# Patient Record
Sex: Male | Born: 1970 | Race: White | Hispanic: No | Marital: Married | Smoking: Never smoker
Health system: Southern US, Community
[De-identification: ages and names within clinical notes are randomized; demographics above are authoritative.]

## PROBLEM LIST (undated history)

## (undated) DIAGNOSIS — E785 Hyperlipidemia, unspecified: Secondary | ICD-10-CM

## (undated) DIAGNOSIS — Z87442 Personal history of urinary calculi: Secondary | ICD-10-CM

## (undated) DIAGNOSIS — F419 Anxiety disorder, unspecified: Secondary | ICD-10-CM

## (undated) DIAGNOSIS — K5792 Diverticulitis of intestine, part unspecified, without perforation or abscess without bleeding: Secondary | ICD-10-CM

## (undated) DIAGNOSIS — R51 Headache: Secondary | ICD-10-CM

## (undated) DIAGNOSIS — M5430 Sciatica, unspecified side: Secondary | ICD-10-CM

## (undated) DIAGNOSIS — T7840XA Allergy, unspecified, initial encounter: Secondary | ICD-10-CM

## (undated) DIAGNOSIS — R519 Headache, unspecified: Secondary | ICD-10-CM

## (undated) DIAGNOSIS — A498 Other bacterial infections of unspecified site: Secondary | ICD-10-CM

## (undated) HISTORY — DX: Hyperlipidemia, unspecified: E78.5

## (undated) HISTORY — PX: TYMPANOSTOMY TUBE PLACEMENT: SHX32

## (undated) HISTORY — PX: LITHOTRIPSY: SUR834

## (undated) HISTORY — DX: Anxiety disorder, unspecified: F41.9

## (undated) HISTORY — DX: Allergy, unspecified, initial encounter: T78.40XA

## (undated) HISTORY — PX: WISDOM TOOTH EXTRACTION: SHX21

## (undated) HISTORY — PX: EYE SURGERY: SHX253

---

## 2005-06-21 ENCOUNTER — Emergency Department: Payer: Self-pay | Admitting: Internal Medicine

## 2006-11-24 ENCOUNTER — Emergency Department: Payer: Self-pay | Admitting: Emergency Medicine

## 2014-03-05 ENCOUNTER — Emergency Department: Payer: Self-pay | Admitting: Emergency Medicine

## 2014-03-05 LAB — COMPREHENSIVE METABOLIC PANEL
ALK PHOS: 95 U/L
AST: 21 U/L (ref 15–37)
Albumin: 4.1 g/dL (ref 3.4–5.0)
Anion Gap: 5 — ABNORMAL LOW (ref 7–16)
BUN: 16 mg/dL (ref 7–18)
Bilirubin,Total: 0.4 mg/dL (ref 0.2–1.0)
CO2: 29 mmol/L (ref 21–32)
Calcium, Total: 8.9 mg/dL (ref 8.5–10.1)
Chloride: 103 mmol/L (ref 98–107)
Creatinine: 1.1 mg/dL (ref 0.60–1.30)
Glucose: 122 mg/dL — ABNORMAL HIGH (ref 65–99)
Osmolality: 276 (ref 275–301)
Potassium: 3.7 mmol/L (ref 3.5–5.1)
SGPT (ALT): 23 U/L (ref 12–78)
Sodium: 137 mmol/L (ref 136–145)
Total Protein: 7.3 g/dL (ref 6.4–8.2)

## 2014-03-05 LAB — URINALYSIS, COMPLETE
BACTERIA: NONE SEEN
BILIRUBIN, UR: NEGATIVE
BLOOD: NEGATIVE
GLUCOSE, UR: NEGATIVE mg/dL (ref 0–75)
Leukocyte Esterase: NEGATIVE
Nitrite: NEGATIVE
Ph: 5 (ref 4.5–8.0)
Protein: 30
RBC,UR: 2 /HPF (ref 0–5)
Specific Gravity: 1.032 (ref 1.003–1.030)
WBC UR: 4 /HPF (ref 0–5)

## 2014-03-05 LAB — CBC
HCT: 40.8 % (ref 40.0–52.0)
HGB: 14.3 g/dL (ref 13.0–18.0)
MCH: 31.2 pg (ref 26.0–34.0)
MCHC: 35 g/dL (ref 32.0–36.0)
MCV: 89 fL (ref 80–100)
Platelet: 212 10*3/uL (ref 150–440)
RBC: 4.59 10*6/uL (ref 4.40–5.90)
RDW: 12.4 % (ref 11.5–14.5)
WBC: 9.7 10*3/uL (ref 3.8–10.6)

## 2014-03-07 ENCOUNTER — Ambulatory Visit: Payer: Self-pay | Admitting: Urology

## 2014-03-09 ENCOUNTER — Ambulatory Visit: Payer: Self-pay | Admitting: Urology

## 2014-03-10 ENCOUNTER — Ambulatory Visit: Payer: Self-pay | Admitting: Urology

## 2015-03-31 IMAGING — CT CT STONE STUDY
3 of 4 series · 5 of 16 positions shown, 6 images · non-contrast
Comparison: None.

CLINICAL DATA: Left flank pain, nausea and diarrhea.

EXAM:
CT ABDOMEN AND PELVIS WITHOUT CONTRAST
TECHNIQUE: Multidetector CT imaging of the abdomen and pelvis was performed
following the standard protocol without IV contrast.

[Series 4: lung · axial · 0.67mm/px · z∈[-1173,-1173]mm · 1 of 25 slices shown, 2 images]
[im 1/25  soft-tissue]
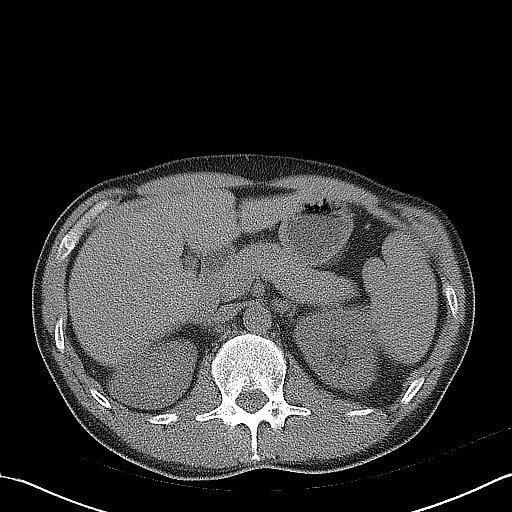
[im 1/25  bone]
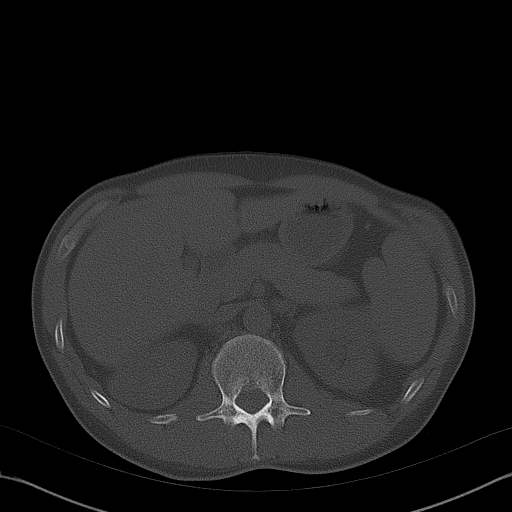

[Series 5: coronal · coronal · 0.61mm/px · 3 of 114 slices shown]
[im 29/114  soft-tissue]
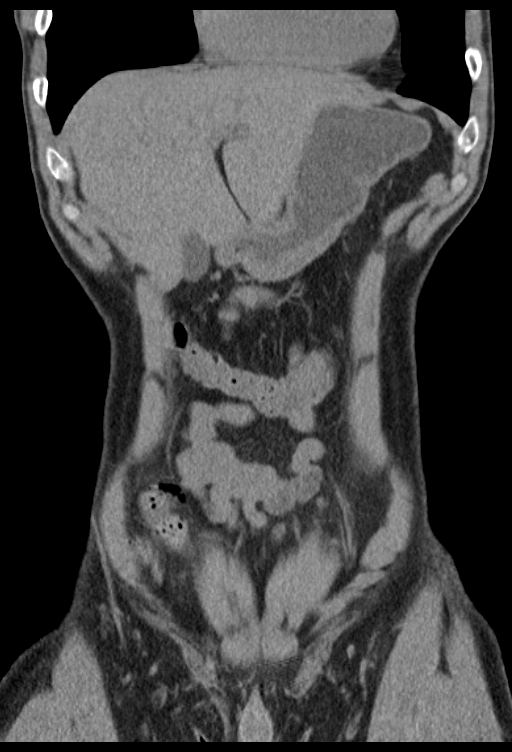
[im 57/114  soft-tissue]
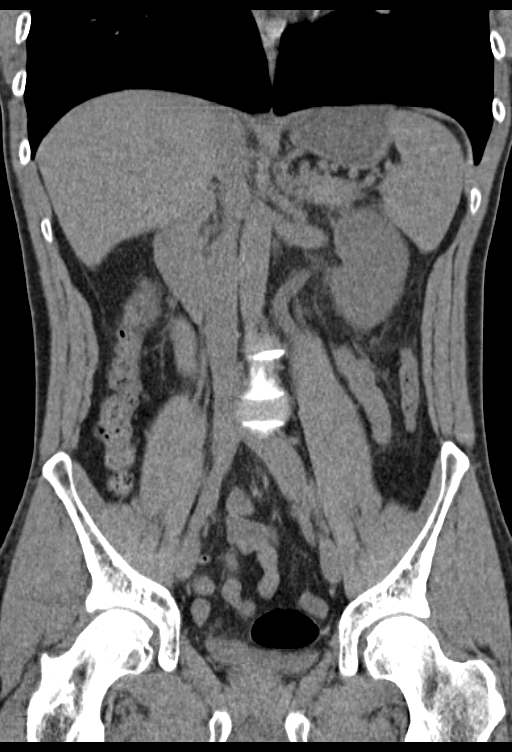
[im 85/114  soft-tissue]
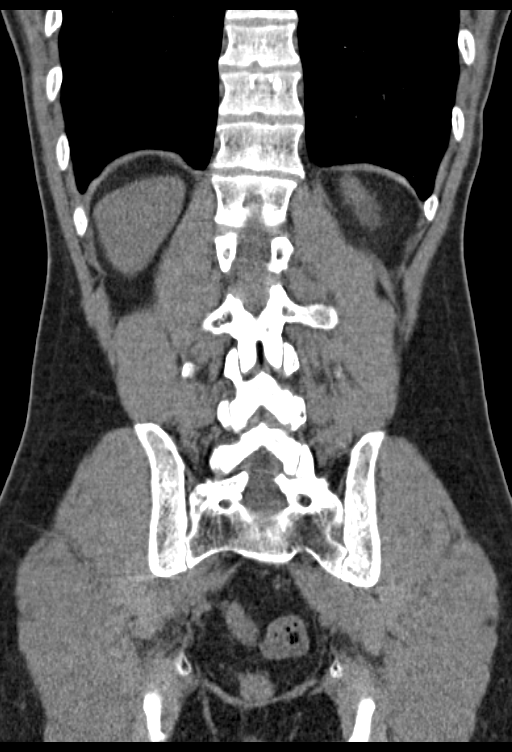

[Series 6: sagittal · sagittal · 0.47mm/px · 1 of 154 slices shown]
[im 62/154  soft-tissue]
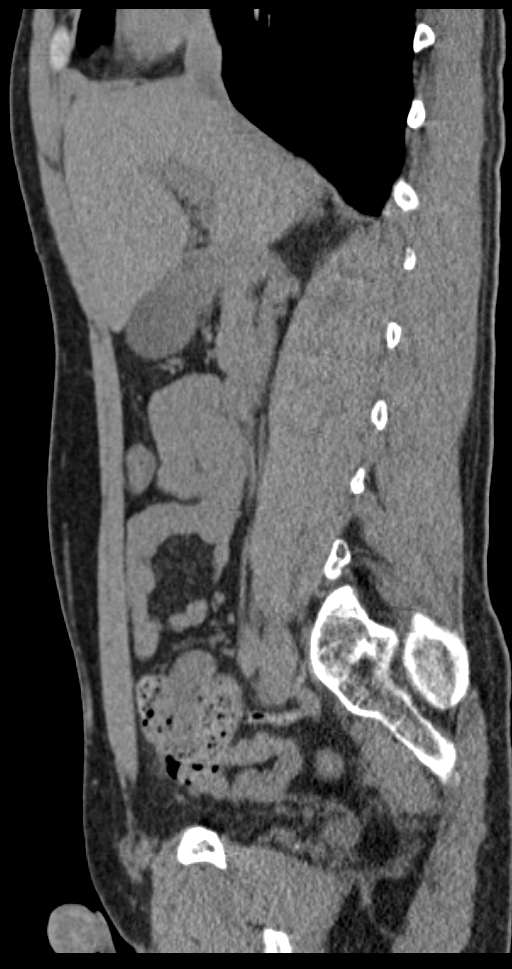

[5 of 16 positions shown; findings below may reference images not displayed]

FINDINGS: Mild to moderate dilatation of the left renal collecting system and
ureter to the level of a 5 mm distal left ureteral calculus at the
ureterovesical junction. The scout image extends slightly below the
level of the calculus without including the entire inferior pelvis.
The calculus is not visible on that image. No bladder, or right
ureteral or renal calculi are seen. The left kidney is mildly
enlarged with mild left perinephric soft tissue stranding. The right
kidney has a normal appearance.

Normal non contrasted appearance of the liver, spleen, pancreas,
gallbladder, adrenal glands and prostate gland. No gastrointestinal
abnormalities or enlarged lymph nodes. Clear lung bases. Mild lumbar
and lower thoracic spine degenerative changes.
IMPRESSION: 5 mm distal left ureteral calculus at the ureterovesical junction,
causing mild to moderate left hydronephrosis and hydroureter.

## 2015-04-02 IMAGING — CR DG ABDOMEN 1V
1 series · 2 of 2 positions shown · non-contrast
Comparison: CT STONE STUDY dated 03/05/2014

CLINICAL DATA: Recent left UVJ stone

EXAM:
ABDOMEN - 1 VIEW

[Series 1: supine kub · 0.17mm/px · 2 of 2 slices shown]
[im 1/2]
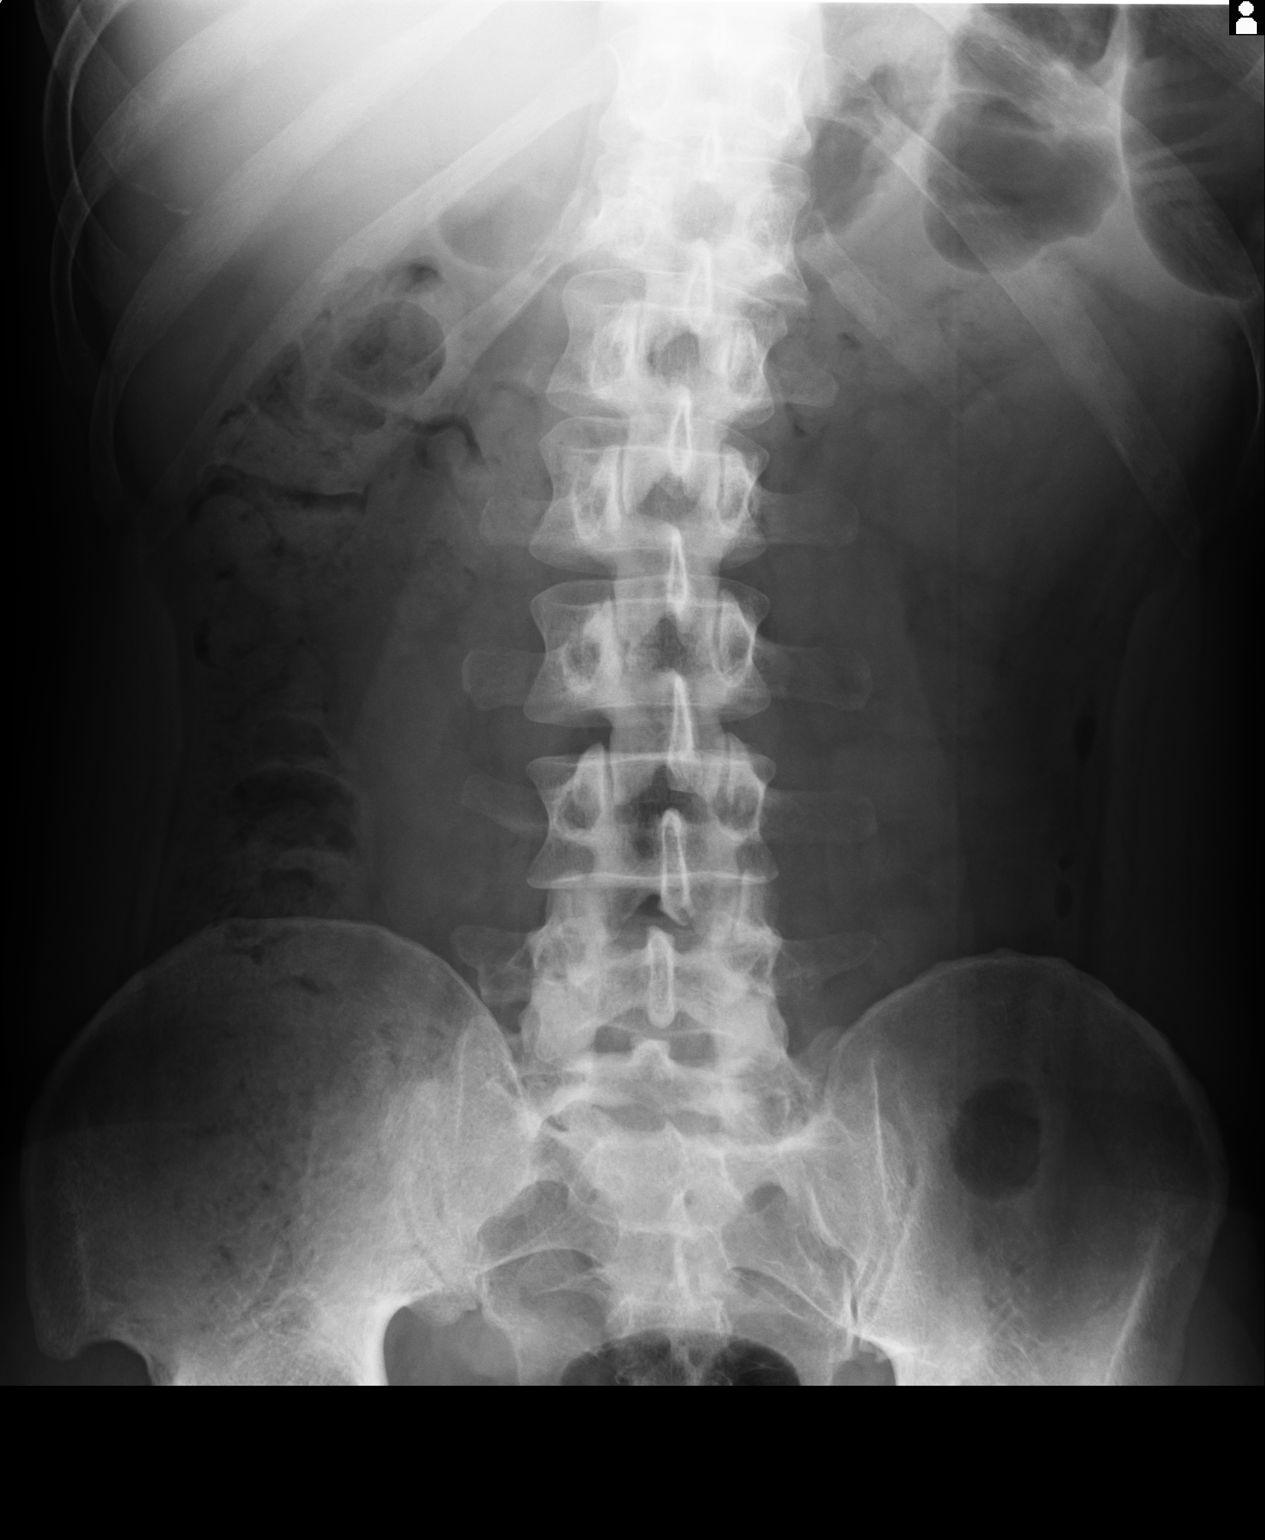
[im 2/2]
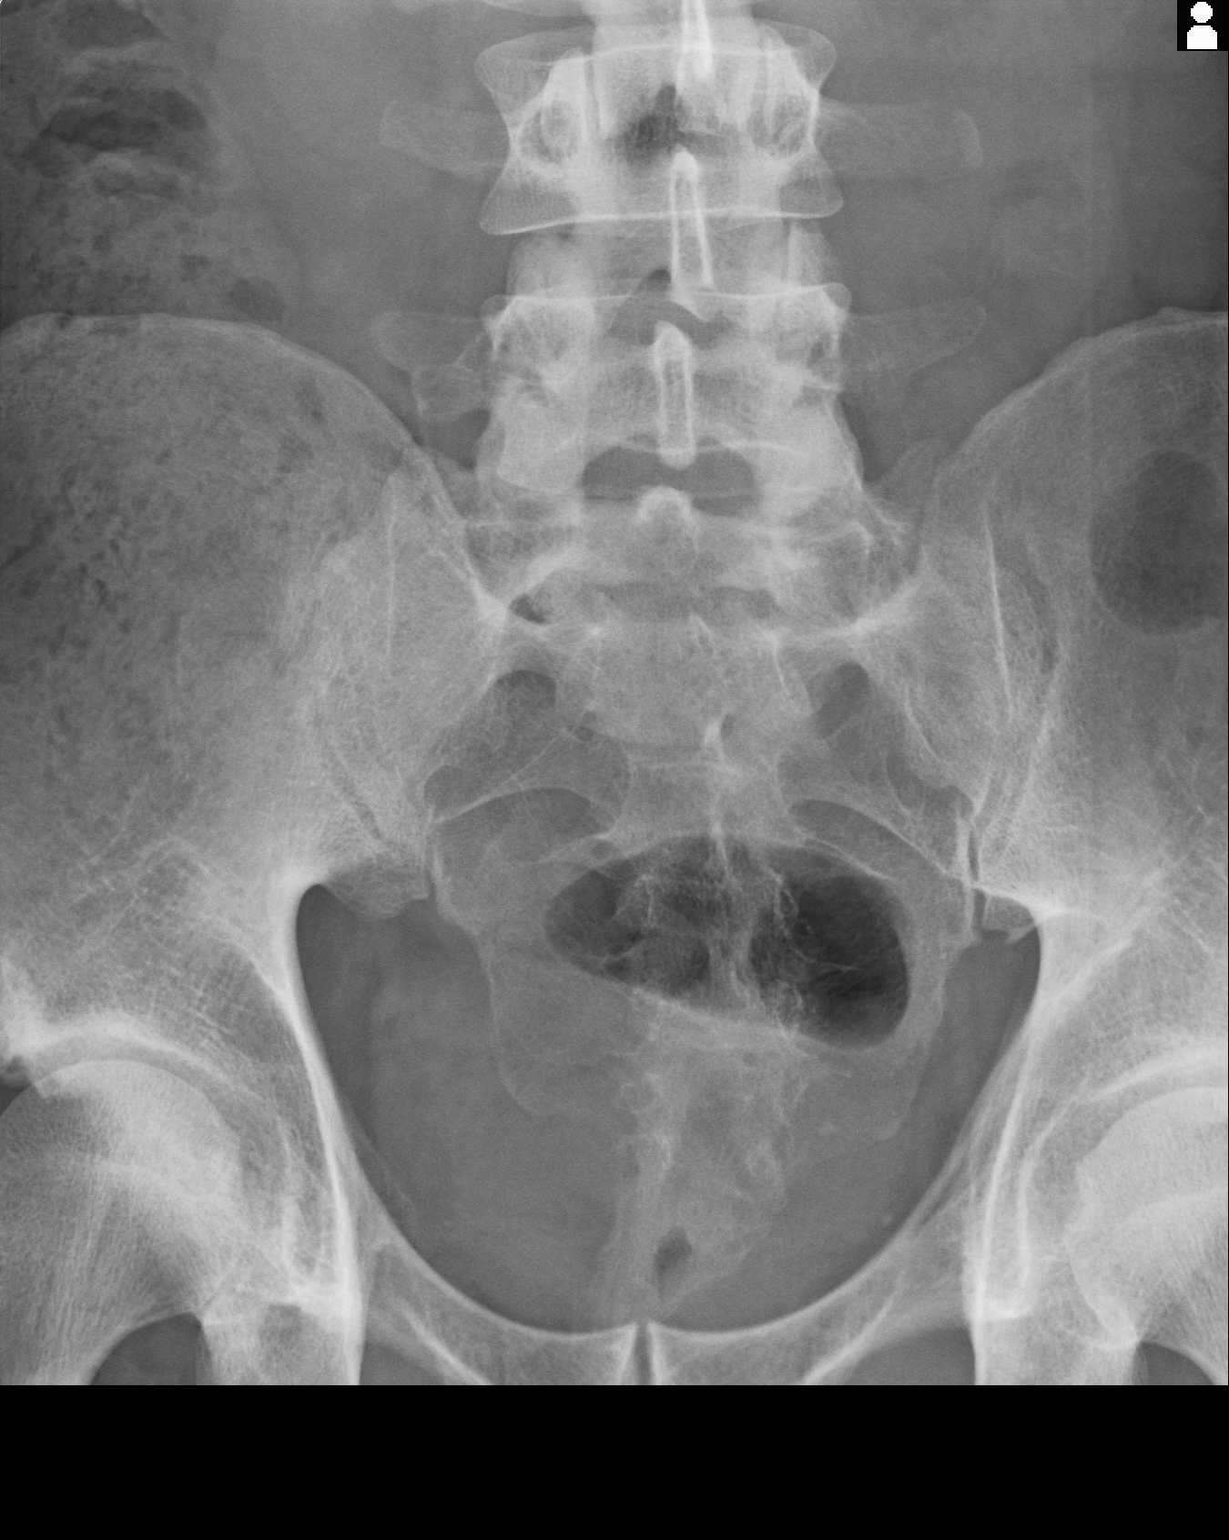

[2 of 2 positions shown; findings below may reference images not displayed]

FINDINGS: No abnormal calcifications are demonstrated over the kidneys or
proximal or mid ureters. Within the true bony pelvis there is a
coarse calcification projecting over the left aspect of the lower
sacrum which likely reflects the known approximately 5 mm diameter
UVJ stone.

The bowel gas pattern is within the limits of normal. The bony
structures exhibit no acute abnormality.
IMPRESSION: The known distal left ureteral stone is again demonstrated and does
not appear to have changed significantly in position since the
previous study.

## 2015-04-05 IMAGING — CR DG ABDOMEN 1V
1 series · 2 of 2 positions shown · non-contrast
Comparison: DG ABDOMEN 1V dated 03/07/2014 new

CLINICAL DATA: ? Left renal Calculus

EXAM:
ABDOMEN - 1 VIEW

[Series 1: t abdomen supine · 0.14mm/px · 2 of 2 slices shown]
[im 1/2]
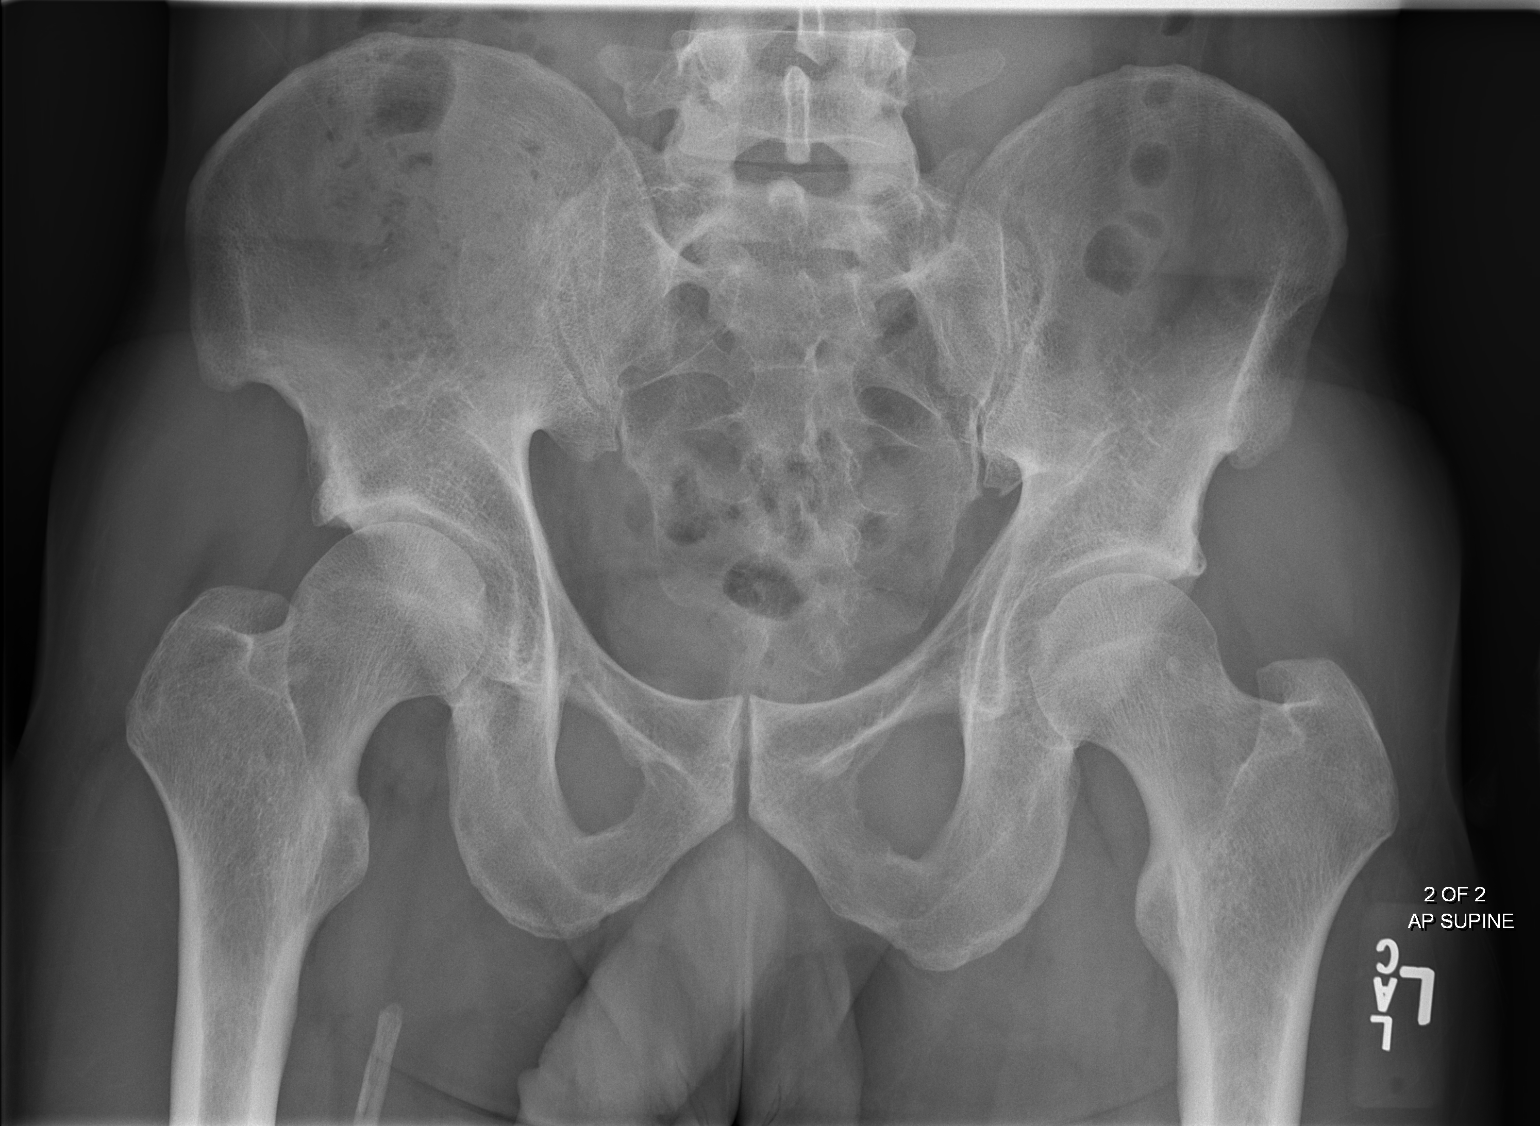
[im 2/2]
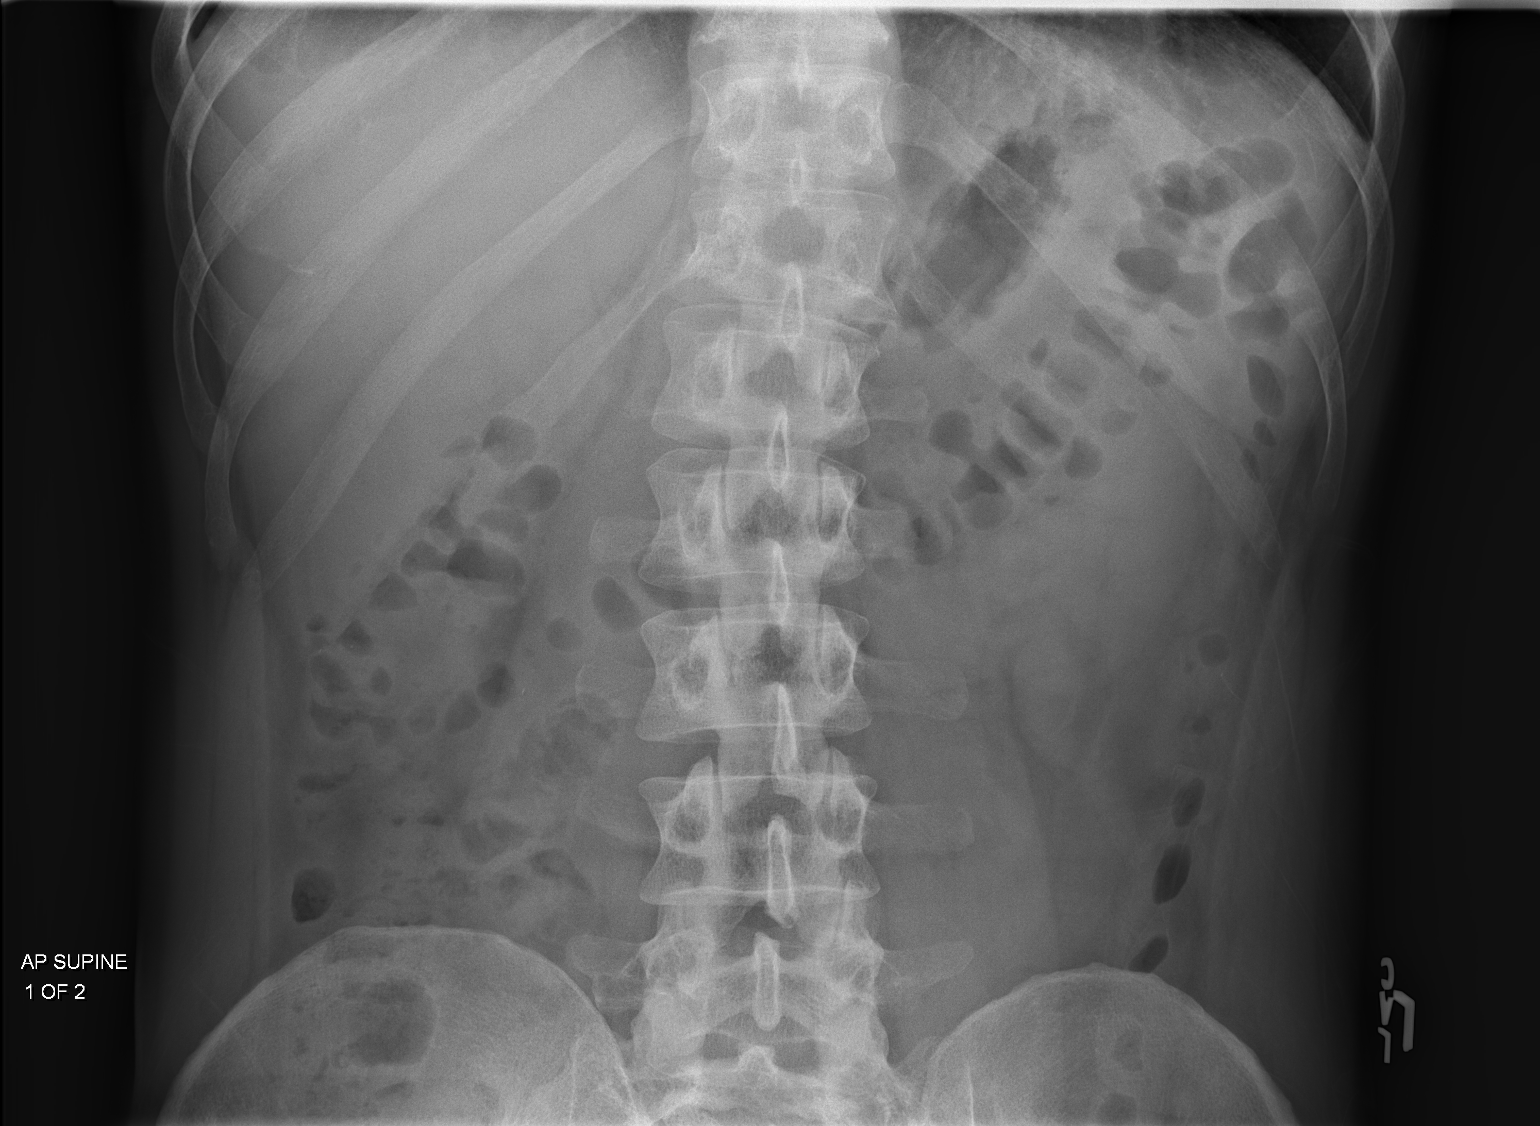

[2 of 2 positions shown; findings below may reference images not displayed]

FINDINGS: The bowel gas pattern is normal. No radio-opaque calculi or other
significant radiographic abnormality are seen.
IMPRESSION: Negative.

## 2015-07-31 ENCOUNTER — Encounter: Payer: Self-pay | Admitting: *Deleted

## 2015-08-03 NOTE — Discharge Instructions (Signed)
[facility] °[facility] °ENDOSCOPIC SINUS SURGERY ° [facility] ° °What is Functional Endoscopic Sinus Surgery? ° The Surgery involves making the natural openings of the sinuses larger by removing the bony partitions that separate the sinuses from the nasal cavity.  The natural sinus lining is preserved as much as possible to allow the sinuses to resume normal function after the surgery.  In some patients nasal polyps (excessively swollen lining of the sinuses) may be removed to relieve obstruction of the sinus openings.  The surgery is performed through the nose using lighted scopes, which eliminates the need for incisions on the face.  A septoplasty is a different procedure which is sometimes performed with sinus surgery.  It involves straightening the boy partition that separates the two sides of your nose.  A crooked or deviated septum may need repair if is obstructing the sinuses or nasal airflow.  Turbinate reduction is also often performed during sinus surgery.  The turbinates are bony proturberances from the side walls of the nose which swell and can obstruct the nose in patients with sinus and allergy problems.  Their size can be surgically reduced to help relieve nasal obstruction. ° °What Can Sinus Surgery Do For Me? ° Sinus surgery can reduce the frequency of sinus infections requiring antibiotic treatment.  This can provide improvement in nasal congestion, post-nasal drainage, facial pressure and nasal obstruction.  Surgery will NOT prevent you from ever having an infection again, so it usually only for patients who get infections 4 or more times yearly requiring antibiotics, or for infections that do not clear with antibiotics.  It will not cure nasal allergies, so patients with allergies may still require medication to treat their allergies after surgery. Surgery may improve headaches related to sinusitis, however, some people will continue to  require medication to control sinus headaches related to allergies.  Surgery will do nothing for other forms of headache (migraine, tension or cluster). °What Are the Risks of Endoscopic Sinus Surgery? ° Current techniques allow surgery to be performed safely with little risk, however, there are rare complications that patients should be aware of.  Because the sinuses are located around the eyes, there is risk of eye injury, including blindness, though again, this would be quite rare. This is usually a result of bleeding behind the eye during surgery, which puts the vision oat risk, though there are treatments to protect the vision and prevent permanent disrupted by surgery causing a leak of the spinal fluid that surrounds the brain.  More serious complications would include bleeding inside the brain cavity or damage to the brain.  Again, all of these complications are uncommon, and spinal fluid leaks can be safely managed surgically if they occur.  The most common complication of sinus surgery is bleeding from the nose, which may require packing or cauterization of the nose.  Continued sinus have polyps may experience recurrence of the polyps requiring revision surgery.  Alterations of sense of smell or injury to the tear ducts are also rare complications.  °What is the Surgery Like, and what is the Recovery? ° The Surgery usually takes a couple of hours to perform, and is usually performed under a general anesthetic (completely asleep).  Patients are usually discharged home after a couple of hours.  Sometimes during surgery it is necessary to pack the nose to control bleeding, and the packing is left in place for 24 - 48 hours, and removed by your surgeon.  If   a septoplasty was performed during the procedure, there is often a splint placed which must be removed after 5-7 days.   °Discomfort: Pain is usually mild to moderate, and can be controlled by prescription pain medication or acetaminophen (Tylenol).   Aspirin, Ibuprofen (Advil, Motrin), or Naprosyn (Aleve) should be avoided, as they can cause increased bleeding.  Most patients feel sinus pressure like they have a bad head cold for several days.  Sleeping with your head elevated can help reduce swelling and facial pressure, as can ice packs over the face.  A humidifier may be helpful to keep the mucous and blood from drying in the nose.  °Diet: There are no specific diet restrictions, however, you should generally start with clear liquids and a light diet of bland foods because the anesthetic can cause some nausea.  Advance your diet depending on how your stomach feels.  Taking your pain medication with food will often help reduce stomach upset which pain medications can cause. ° °Nasal Saline Irrigation: It is important to remove blood clots and dried mucous from the nose as it is healing.  This is done by having you irrigate the nose at least 3 - 4 times daily with a salt water solution.  The salt water solution is made as follows:  1) 2 - 3 heaping teaspoons of canning, pickling or sea salt °  2) 1 teaspoon baking soda, such as [employer] °  3) 1 quart of warm distilled water °The nose is irrigated using an ear syringe (available at the drug store).  Fill the bulb with the solution, bend over a sink, and insert the syringe into the nose ½ to ¾ of an inch.  Point the tip of the syringe towards the inside corner of the eye on the same side your irrigating.  Squeeze the syringe and gently irrigate the nose.  If you bend forward as you do this, most of the fluid will flow back out of the nose, instead of down your throat.  Make a new solution every 2 - 3 days.  The solution should be ward, near body temperature, when you irrigate. ° °Note that if you are instructed to use Nasal Steroid Sprays at any time after your surgery, irrigate with saline BEFORE using the steroid spray, so you do not wash it all out of the nose. °Another product, Nasal Saline Gel (such as  AYR Nasal Saline Gel) can be applied in each nostril 3 - 4 times daily to moisture the nose and reduce scabbing or crusting. ° °Bleeding:  Bloody drainage from the nose can be expected for several days, and patients are instructed to irrigate their nose frequently with salt water to help remove mucous and blood clots.  The drainage may be dark red or brown, though some fresh blood may be seen intermittently, especially after irrigation.  Do not blow you nose, as bleeding may occur. If you must sneeze, keep your mouth open to allow air to escape through your mouth. °If heavy bleeding occurs: Irrigate the nose with saline to rinse out clots, then spray the nose 3 - 4 times with Afrin Nasal Decongestant Spray.  The spray will constrict the blood vessels to slow bleeding.  Pinch the lower half of your nose shut to apply pressure, and lay down with your head elevated.  Ice packs over the nose may help as well. If bleeding persists despite these measures, you should notify your doctor.  Do not use the Afrin routinely   to control nasal congestion after surgery, as it can result in worsening congestion and may affect healing.  ° °Activity: Return to work varies among patients. Most patients will be out of work at least 5 - 7 days to recover.  Patient may return to work after they are off of narcotic pain medication, and feeling well enough to perform the functions of their job.  Patients must avoid heavy lifting (over 10 pounds) or strenuous physical for 2 weeks after surgery, so your employer may need to assign you to light duty, or keep you out of work longer if light duty is not possible.  NOTE: you should not drive, operate dangerous machinery, do any mentally demanding tasks or make any important legal or financial decisions while on narcotic pain medication and recovering from the general anesthetic.  °  °Call Your Doctor Immediately if You Have Any of the Following: °1. Bleeding that you cannot control with the above  measures °2. Loss of vision, double vision, bulging of the eye or black eyes. °3. Fever over 101 degrees °4. Neck stiffness with severe headache, fever, nausea and change in mental state. °You are always encourage to call anytime with concerns, however, please call with requests for pain medication refills during office hours. ° °Office Endoscopy: During follow-up visits your doctor will remove any packing or splints that may have been placed and evaluate and clean your sinuses endoscopically.  Topical anesthetic will be used to make this as comfortable as possible, though you may want to take your pain medication prior to the visit.  How often this will need to be done varies from patient to patient.  After complete recovery from the surgery, you may need follow-up endoscopy from time to time, particularly if there is concern of recurrent infection or nasal polyps. ° ° °General Anesthesia, Care After °Refer to this sheet in the next few weeks. These instructions provide you with information on caring for yourself after your procedure. Your health care provider may also give you more specific instructions. Your treatment has been planned according to current medical practices, but problems sometimes occur. Call your health care provider if you have any problems or questions after your procedure. °WHAT TO EXPECT AFTER THE PROCEDURE °After the procedure, it is typical to experience: °· Sleepiness. °· Nausea and vomiting. °HOME CARE INSTRUCTIONS °· For the first 24 hours after general anesthesia: °¨ Have a responsible person with you. °¨ Do not drive a car. If you are alone, do not take public transportation. °¨ Do not drink alcohol. °¨ Do not take medicine that has not been prescribed by your health care provider. °¨ Do not sign important papers or make important decisions. °¨ You may resume a normal diet and activities as directed by your health care provider. °· Change bandages (dressings) as directed. °· If you  have questions or problems that seem related to general anesthesia, call the hospital and ask for the anesthetist or anesthesiologist on call. °SEEK MEDICAL CARE IF: °· You have nausea and vomiting that continue the day after anesthesia. °· You develop a rash. °SEEK IMMEDIATE MEDICAL CARE IF:  °· You have difficulty breathing. °· You have chest pain. °· You have any allergic problems. °Document Released: 03/17/2001 Document Revised: 12/14/2013 Document Reviewed: 06/24/2013 °[employer]® Patient Information ©2015 [employer]. This information is not intended to replace advice given to you by your health care provider. Make sure you discuss any questions you have with your health care provider. ° °

## 2015-08-04 ENCOUNTER — Ambulatory Visit
Admission: RE | Admit: 2015-08-04 | Discharge: 2015-08-04 | Disposition: A | Discharge status: Home / Self Care | Payer: BC Managed Care – PPO | Source: Ambulatory Visit | Attending: Unknown Physician Specialty | Admitting: Unknown Physician Specialty

## 2015-08-04 ENCOUNTER — Ambulatory Visit: Payer: BC Managed Care – PPO | Admitting: Anesthesiology

## 2015-08-04 ENCOUNTER — Encounter
Admission: RE | Disposition: A | Discharge status: Home / Self Care | Payer: Self-pay | Source: Ambulatory Visit | Attending: Unknown Physician Specialty

## 2015-08-04 DIAGNOSIS — J3489 Other specified disorders of nose and nasal sinuses: Secondary | ICD-10-CM | POA: Diagnosis present

## 2015-08-04 DIAGNOSIS — J342 Deviated nasal septum: Secondary | ICD-10-CM | POA: Insufficient documentation

## 2015-08-04 DIAGNOSIS — F419 Anxiety disorder, unspecified: Secondary | ICD-10-CM | POA: Diagnosis not present

## 2015-08-04 DIAGNOSIS — J343 Hypertrophy of nasal turbinates: Secondary | ICD-10-CM | POA: Diagnosis not present

## 2015-08-04 HISTORY — PX: SEPTOPLASTY: SHX2393

## 2015-08-04 HISTORY — DX: Headache, unspecified: R51.9

## 2015-08-04 HISTORY — DX: Headache: R51

## 2015-08-04 SURGERY — SEPTOPLASTY, NOSE
Anesthesia: General | Laterality: Bilateral | Wound class: Clean Contaminated

## 2015-08-04 MED ORDER — OXYCODONE-ACETAMINOPHEN 7.5-325 MG PO TABS: 1.0000 | ORAL_TABLET | ORAL | Status: DC | PRN

## 2015-08-04 MED ORDER — SULFAMETHOXAZOLE-TRIMETHOPRIM 800-160 MG PO TABS: 1.0000 | ORAL_TABLET | Freq: Two times a day (BID) | ORAL | Status: DC

## 2015-08-04 MED ORDER — PROPOFOL 10 MG/ML IV BOLUS
INTRAVENOUS | Status: DC | PRN
  Administered 2015-08-04: 200 mg via INTRAVENOUS

## 2015-08-04 MED ORDER — LACTATED RINGERS IV SOLN
INTRAVENOUS | Status: DC
  Administered 2015-08-04: 10:00:00 via INTRAVENOUS

## 2015-08-04 MED ORDER — DEXAMETHASONE SODIUM PHOSPHATE 4 MG/ML IJ SOLN
INTRAMUSCULAR | Status: DC | PRN
  Administered 2015-08-04: 8 mg via INTRAVENOUS

## 2015-08-04 MED ORDER — PHENYLEPHRINE HCL 0.5 % NA SOLN
NASAL | Status: DC | PRN
  Administered 2015-08-04: 15 mL via TOPICAL

## 2015-08-04 MED ORDER — LIDOCAINE-EPINEPHRINE 1 %-1:100000 IJ SOLN
INTRAMUSCULAR | Status: DC | PRN
  Administered 2015-08-04: 12 mL

## 2015-08-04 MED ORDER — SUCCINYLCHOLINE CHLORIDE 20 MG/ML IJ SOLN
INTRAMUSCULAR | Status: DC | PRN
  Administered 2015-08-04: 100 mg via INTRAVENOUS

## 2015-08-04 MED ORDER — OXYMETAZOLINE HCL 0.05 % NA SOLN
1.0000 | Freq: Two times a day (BID) | NASAL | Status: DC
  Administered 2015-08-04: 1 via NASAL

## 2015-08-04 MED ORDER — OXYCODONE HCL 5 MG/5ML PO SOLN: 5.0000 mg | Freq: Once | ORAL | Status: DC | PRN

## 2015-08-04 MED ORDER — MIDAZOLAM HCL 5 MG/5ML IJ SOLN
INTRAMUSCULAR | Status: DC | PRN
  Administered 2015-08-04: 2 mg via INTRAVENOUS

## 2015-08-04 MED ORDER — HYDROMORPHONE HCL 1 MG/ML IJ SOLN
0.2500 mg | INTRAMUSCULAR | Status: DC | PRN
  Administered 2015-08-04 (×2): 0.5 mg via INTRAVENOUS

## 2015-08-04 MED ORDER — ONDANSETRON HCL 4 MG/2ML IJ SOLN
INTRAMUSCULAR | Status: DC | PRN
  Administered 2015-08-04: 4 mg via INTRAVENOUS

## 2015-08-04 MED ORDER — OXYCODONE HCL 5 MG PO TABS: 5.0000 mg | ORAL_TABLET | Freq: Once | ORAL | Status: DC | PRN

## 2015-08-04 MED ORDER — ONDANSETRON HCL 4 MG/2ML IJ SOLN: 4.0000 mg | Freq: Once | INTRAMUSCULAR | Status: DC | PRN

## 2015-08-04 MED ORDER — LIDOCAINE HCL (CARDIAC) 20 MG/ML IV SOLN
INTRAVENOUS | Status: DC | PRN
  Administered 2015-08-04: 40 mg via INTRAVENOUS

## 2015-08-04 MED ORDER — FENTANYL CITRATE (PF) 100 MCG/2ML IJ SOLN
INTRAMUSCULAR | Status: DC | PRN
  Administered 2015-08-04: 100 ug via INTRAVENOUS

## 2015-08-04 SURGICAL SUPPLY — 29 items
BLADE SURG 15 STRL LF DISP TIS (BLADE) IMPLANT
BLADE SURG 15 STRL SS (BLADE)
COAG SUCT 10F 3.5MM HAND CTRL (MISCELLANEOUS) ×3 IMPLANT
DRAPE HEAD BAR (DRAPES) ×3 IMPLANT
DRESSING NASL FOAM PST OP SINU (MISCELLANEOUS) ×2 IMPLANT
DRSG NASAL FOAM POST OP SINU (MISCELLANEOUS) ×6
GLOVE BIO SURGEON STRL SZ7.5 (GLOVE) ×6 IMPLANT
HANDLE YANKAUER SUCT BULB TIP (MISCELLANEOUS) ×3 IMPLANT
NEEDLE HYPO 25GX1X1/2 BEV (NEEDLE) ×3 IMPLANT
NS IRRIG 500ML POUR BTL (IV SOLUTION) ×3 IMPLANT
PACK DRAPE NASAL/ENT (PACKS) ×3 IMPLANT
PAD GROUND ADULT SPLIT (MISCELLANEOUS) ×3 IMPLANT
SOL ANTI-FOG 6CC FOG-OUT (MISCELLANEOUS) ×1 IMPLANT
SOL FOG-OUT ANTI-FOG 6CC (MISCELLANEOUS) ×2
SPLINT NASAL SEPTAL BLV .25 LG (MISCELLANEOUS) ×3 IMPLANT
SPLINT NASAL SEPTAL BLV .50 ST (MISCELLANEOUS) ×3 IMPLANT
SPONGE NEURO XRAY DETECT 1X3 (DISPOSABLE) ×3 IMPLANT
STRAP BODY AND KNEE 60X3 (MISCELLANEOUS) ×3 IMPLANT
SUT CHROMIC 3-0 (SUTURE)
SUT CHROMIC 3-0 KS 27XMFL CR (SUTURE)
SUT CHROMIC 5-0 (SUTURE)
SUT CHROMIC 5-0 P2 18XMFL CR (SUTURE)
SUT ETHILON 3-0 KS 30 BLK (SUTURE) ×3 IMPLANT
SUT PLAIN GUT 4-0 (SUTURE) ×3 IMPLANT
SUTURE CHRMC 3-0 KS 27XMFL CR (SUTURE) IMPLANT
SUTURE CHRMC 5-0 P2 18XMF CR (SUTURE) IMPLANT
SYRINGE 10CC LL (SYRINGE) ×3 IMPLANT
TOWEL OR 17X26 4PK STRL BLUE (TOWEL DISPOSABLE) IMPLANT
WATER STERILE IRR 500ML POUR (IV SOLUTION) ×3 IMPLANT

## 2015-08-04 NOTE — Op Note (Signed)
PREOPERATIVE DIAGNOSIS:  Chronic nasal obstruction.  POSTOPERATIVE DIAGNOSIS:  Chronic nasal obstruction.  SURGEON:  Davina Poke, M.D.  NAME OF PROCEDURE:  1. Nasal septoplasty. 2. Submucous resection of inferior turbinates.  OPERATIVE FINDINGS:  Severe nasal septal deformity, hypertrophy of the inferior turbinates.   DESCRIPTION OF THE PROCEDURE:  Tanner Scott was identified in the holding area and taken to the operating room and placed in the supine position.  After general endotracheal anesthesia was induced, the table was turned 45 degrees and the patient was placed in a semi-Fowler position.  The nose was then topically anesthetized with Lidocaine, cotton pledgets were placed within each nostril. After approximately 5 minutes, this was removed at which time a local anesthetic of 1% Lidocaine 1:100,000 units of Epinephrine was used to inject the inferior turbinates in the nasal septum. A total of 13 ml was used. Examination of the nose showed a severe left nasal septal deformity and tremendous hypertrophied inferior turbinate.  Beginning on the right hand side a hemitransfixion incision was then created on the leading edge of the septum on the right.  A subperichondrial plane was elevated posteriorly on the left and taken back to the perpendicular plate of the ethmoid where subperiosteal plane was elevated posteriorly on the left. A large septal spur was identified on the left hand side impacting on the inferior turbinate.  An inferior rim of cartilage was removed anteriorly with care taken to leave an anterior strut to prevent nasal collapse. With this strut removed the perpendicular plate of the ethmoid was separated from the quadrangular cartilage. The large septal spur was removed.  The septum was then replaced in the midline. Reinspection through each nostril showed excellent reduction of the septal deformity. A left posterior inferior fenestration was then created to allow hematoma  drainage.  With the septoplasty completed, beginning on the left-hand side, a 15 blade was used to incise along the inferior edge of the inferior turbinate. A superior laterally based flap was then elevated. The underlying conchal bone of mucosa was excised using Knight scissors. The flap was then laid back over the turbinate stump and cauterized using suction cautery. In a similar fashion the submucous resection was performed on the right.  With the submucous resection completed bilaterally and no active bleeding, the hemitransfixion incision was then closed using two interrupted 3-0 chromic sutures.  Plastic nasal septal splints were placed within each nostril and affixed to the septum using a 3-0 nylon suture. Stammberger was then used beneath each inferior turbinate for hemostasis.    The patient tolerated the procedure well, was returned to anesthesia, extubated in the operating room, and taken to the recovery room in stable condition.    CULTURES:  None.  SPECIMENS:  None.  ESTIMATED BLOOD LOSS:  25 cc.  Tanner Scott  08/04/2015  11:01 AM

## 2015-08-04 NOTE — Anesthesia Preprocedure Evaluation (Signed)
Anesthesia Evaluation  Patient identified by MRN, date of birth, ID band Patient awake    Reviewed: Allergy & Precautions, H&P , NPO status   Airway Mallampati: II  TM Distance: >3 FB Neck ROM: full    Dental no notable dental hx.    Pulmonary neg pulmonary ROS,    Pulmonary exam normal       Cardiovascular negative cardio ROS Normal cardiovascular exam    Neuro/Psych Mild anxiety   GI/Hepatic negative GI ROS, Neg liver ROS,   Endo/Other  negative endocrine ROS  Renal/GU negative Renal ROS  negative genitourinary   Musculoskeletal   Abdominal   Peds  Hematology negative hematology ROS (+)   Anesthesia Other Findings   Reproductive/Obstetrics                             Anesthesia Physical Anesthesia Plan  ASA: II  Anesthesia Plan: General ETT   Post-op Pain Management:    Induction:   Airway Management Planned:   Additional Equipment:   Intra-op Plan:   Post-operative Plan:   Informed Consent: I have reviewed the patients History and Physical, chart, labs and discussed the procedure including the risks, benefits and alternatives for the proposed anesthesia with the patient or authorized representative who has indicated his/her understanding and acceptance.     Plan Discussed with: CRNA  Anesthesia Plan Comments:         Anesthesia Quick Evaluation

## 2015-08-04 NOTE — Anesthesia Postprocedure Evaluation (Signed)
  Anesthesia Post-op Note  Patient: Tanner Scott  Procedure(s) Performed: Procedure(s): SEPTOPLASTY B SMR TURBINATES (Bilateral)  Anesthesia type:General ETT  Patient location: PACU  Post pain: Pain level controlled  Post assessment: Post-op Vital signs reviewed, Patient's Cardiovascular Status Stable, Respiratory Function Stable, Patent Airway and No signs of Nausea or vomiting  Post vital signs: Reviewed and stable  Last Vitals:  Filed Vitals:   08/04/15 1130  BP: 119/86  Pulse: 75  Temp:   Resp: 14    Level of consciousness: awake, alert  and patient cooperative  Complications: No apparent anesthesia complications

## 2015-08-04 NOTE — H&P (Signed)
  H+P  Reviewed and will be scanned in later. No changes noted. 

## 2015-08-04 NOTE — Anesthesia Procedure Notes (Addendum)
Procedure Name: Intubation Date/Time: 08/04/2015 10:27 AM Performed by: Andee Poles Pre-anesthesia Checklist: Patient identified, Emergency Drugs available, Suction available, Patient being monitored and Timeout performed Patient Re-evaluated:Patient Re-evaluated prior to inductionOxygen Delivery Method: Circle system utilized Preoxygenation: Pre-oxygenation with 100% oxygen Intubation Type: IV induction Ventilation: Mask ventilation without difficulty Laryngoscope Size: Mac and 4 Grade View: Grade III Tube type: Oral Rae Tube size: 7.5 mm Number of attempts: 3 Airway Equipment and Method: Video-laryngoscopy,  Bougie stylet and Rigid stylet Placement Confirmation: ETT inserted through vocal cords under direct vision,  positive ETCO2 and breath sounds checked- equal and bilateral Tube secured with: Tape Dental Injury: Teeth and Oropharynx as per pre-operative assessment  Difficulty Due To: Difficulty was unanticipated and Difficult Airway- due to anterior larynx Future Recommendations: Recommend- induction with short-acting agent, and alternative techniques readily available Comments: Dr. Verner Chol successful with Glidescope x 1 attempt.

## 2015-08-04 NOTE — Transfer of Care (Signed)
Immediate Anesthesia Transfer of Care Note  Patient: Tanner Scott  Procedure(s) Performed: Procedure(s): SEPTOPLASTY B SMR TURBINATES (Bilateral)  Patient Location: PACU  Anesthesia Type: General ETT  Level of Consciousness: awake, alert  and patient cooperative  Airway and Oxygen Therapy: Patient Spontanous Breathing and Patient connected to supplemental oxygen  Post-op Assessment: Post-op Vital signs reviewed, Patient's Cardiovascular Status Stable, Respiratory Function Stable, Patent Airway and No signs of Nausea or vomiting  Post-op Vital Signs: Reviewed and stable  Complications: No apparent anesthesia complications

## 2015-08-07 ENCOUNTER — Encounter: Payer: Self-pay | Admitting: Unknown Physician Specialty

## 2019-12-24 DIAGNOSIS — A498 Other bacterial infections of unspecified site: Secondary | ICD-10-CM

## 2019-12-24 HISTORY — DX: Other bacterial infections of unspecified site: A49.8

## 2020-02-20 ENCOUNTER — Ambulatory Visit: Payer: BC Managed Care – PPO | Attending: Internal Medicine

## 2020-02-20 DIAGNOSIS — Z23 Encounter for immunization: Secondary | ICD-10-CM | POA: Insufficient documentation

## 2020-02-20 NOTE — Progress Notes (Signed)
   Covid-19 Vaccination Clinic  Name:  SUMEDH SHINSATO    MRN: [id number] DOB: 06-Mar-1971  02/20/2020  Mr. Pertuit was observed post Covid-19 immunization for 15 minutes without incidence. He was provided with Vaccine Information Sheet and instruction to access the V-Safe system.   Mr. Hark was instructed to call 911 with any severe reactions post vaccine: Marland Kitchen Difficulty breathing  . Swelling of your face and throat  . A fast heartbeat  . A bad rash all over your body  . Dizziness and weakness    Immunizations Administered    Name Date Dose VIS Date Route   Pfizer COVID-19 Vaccine 02/20/2020  9:14 AM 0.3 mL 12/03/2019 Intramuscular   Manufacturer: [employer], [employer]   Lot: [id number]   NDC: [address]-6

## 2020-03-14 ENCOUNTER — Ambulatory Visit: Payer: BC Managed Care – PPO | Attending: Internal Medicine

## 2020-03-14 DIAGNOSIS — Z23 Encounter for immunization: Secondary | ICD-10-CM

## 2020-03-14 NOTE — Progress Notes (Signed)
   Covid-19 Vaccination Clinic  Name:  Tanner Scott    MRN: [id number] DOB: 1971-04-08  03/14/2020  Mr. Lammert was observed post Covid-19 immunization for 15 minutes without incident. He was provided with Vaccine Information Sheet and instruction to access the V-Safe system.   Mr. Holt was instructed to call 911 with any severe reactions post vaccine: Marland Kitchen Difficulty breathing  . Swelling of face and throat  . A fast heartbeat  . A bad rash all over body  . Dizziness and weakness   Immunizations Administered    Name Date Dose VIS Date Route   Pfizer COVID-19 Vaccine 03/14/2020  3:51 PM 0.3 mL 12/03/2019 Intramuscular   Manufacturer: [employer], [employer]   Lot: [id number]   NDC: [address]-5

## 2020-09-12 ENCOUNTER — Other Ambulatory Visit: Payer: Self-pay | Admitting: General Surgery

## 2020-09-12 NOTE — Progress Notes (Signed)
Subjective:     Patient ID: Tanner Scott is a 49 y.o. male.  HPI  The following portions of the patient's history were reviewed and updated as appropriate.  This is a new patient is here today for: office visit. Patient was referred by Dr. Arlana Pouch for evaluation of diverticulitis. Patient reports Dr. Arlana Pouch treated him with cipro and flagyl. Patient was then diagnosed with c diff and he started on vancomycin on 08-29-20. The patient reports he was having watery diarrhea 10-15 times per day at that time. Patient states now his stools are back to one to two per day and they are well formed since he has completed the vancomycin. He is also here to discuss a colonoscopy. Patient has not had a colonoscopy prior.  The patient brought a detailed list regarding his illness dating back to August 05, 2020.  At that time he reported feeling a cramp below the left rib cage.  Beginning on August 16 and extending through August 18 he had 10+ diarrheal stools per day, losing 5 pounds during this time.  He was diagnosed on August 18 with likely diverticulitis (plain film normal) and a normal CBC.  He was treated with 10 days of Cipro and metronidazole.  2 days later he reported still being uncomfortable at which time Bentyl was added.  4 days after initiation of antibiotics his diarrhea had resolved.  He completed the 10-day course of antibiotics.  During the next few days he gained back his lost weight.  Beginning on September 2 through September 5 he had recurrent episodes of profound diarrhea, 10+ stools per day and during this time lost 7 pounds.  On September 5 laboratory studies showed normal CBC, stool cultures were negative for enteric pathogens but he was found to be positive for C. difficile colitis.  He was placed on oral vancomycin 125 mg 4 times daily beginning on September 7 and in 3 days had resolution of his diarrhea and return of his appetite.  He made use of probiotic antibiotics during this time.   By day 7 he had had reduced cramping and by day 10 his abdominal symptoms had completely resolved.  He denied any bloody diarrhea.      Chief Complaint  Patient presents with  . Diverticulitis     BP 122/66   Pulse 88   Temp 36.3 C (97.4 F)   Ht 177.8 cm (5\' 10" )   Wt 72.6 kg (160 lb)   SpO2 97%   BMI 22.96 kg/m       Past Medical History:  Diagnosis Date  . Allergic rhinitis   . Headache   . Kidney stone   . Sciatica           Past Surgical History:  Procedure Laterality Date  . ear tubes    . LITHOTRIPSY    . wisdom tooth extraction         Social History          Socioeconomic History  . Marital status: Married    Spouse name: Not on file  . Number of children: Not on file  . Years of education: Not on file  . Highest education level: Not on file  Occupational History  . Not on file  Tobacco Use  . Smoking status: Never Smoker  . Smokeless tobacco: Never Used  Substance and Sexual Activity  . Alcohol use: Not on file  . Drug use: Not on file  . Sexual activity: Not on file  Other Topics Concern  . Not on file  Social History Narrative  . Not on file   Social Determinants of Health      Financial Resource Strain:   . Difficulty of Paying Living Expenses:   Food Insecurity:   . Worried About Programme researcher, broadcasting/film/video in the Last Year:   . Barista in the Last Year:   Transportation Needs:   . Freight forwarder (Medical):   Marland Kitchen Lack of Transportation (Non-Medical):            Allergies  Allergen Reactions  . Penicillins Rash    Current Medications        Current Outpatient Medications  Medication Sig Dispense Refill  . ascorbic acid (VITAMIN C) 500 MG tablet Take 500 mg by mouth once daily.    Marland Kitchen atorvastatin (LIPITOR) 10 MG tablet Take 10 mg by mouth once daily    . cholecalciferol (VITAMIN D3) 1,000 unit capsule Take 1,000 Units by mouth once daily.    . coenzyme Q10 10 mg capsule Take 10  mg by mouth once daily.    . fexofenadine (ALLEGRA) 180 MG tablet Take 180 mg by mouth once daily.    . multivitamin tablet Take 1 tablet by mouth once daily.    Marland Kitchen omega-3 fatty acids/fish oil 340-1,000 mg capsule Take 1 capsule by mouth once daily.    . RED YEAST RICE ORAL Take by mouth once daily       . sertraline (ZOLOFT) 100 MG tablet Take 100 mg by mouth once daily.     No current facility-administered medications for this visit.           Family History  Problem Relation Age of Onset  . Breast cancer Mother   . Coronary Artery Disease (Blocked arteries around heart) Father   . Multiple sclerosis Father     Review of Systems  Constitutional: Negative for chills and fever.  Respiratory: Negative for cough.        Objective:   Physical Exam Constitutional:      Appearance: Normal appearance.  Cardiovascular:     Rate and Rhythm: Normal rate and regular rhythm.     Pulses: Normal pulses.     Heart sounds: Normal heart sounds.  Pulmonary:     Effort: Pulmonary effort is normal.     Breath sounds: Normal breath sounds.  Abdominal:     General: Abdomen is flat. Bowel sounds are normal.     Palpations: Abdomen is soft.     Tenderness: There is abdominal tenderness in the right upper quadrant.    Lymphadenopathy:     Lower Body: No right inguinal adenopathy. No left inguinal adenopathy.  Neurological:     Mental Status: He is alert and oriented to person, place, and time.  Psychiatric:        Mood and Affect: Mood normal.        Behavior: Behavior normal.     Labs and Radiology:   Plain film of the abdomen dated August 09, 2020 was reported as normal.  CBC of August 09, 2020 showed a normal CBC with a white blood cell count of 6900.  On August 28, 2019 1 repeat again showed a normal white count.  Basic metabolic panel on August 09, 2020 was normal.  Urinalysis of the same date was notable for marked ketones.     Assessment:      Likely C. difficile colitis rather than diverticulitis based on similarity  of the 2 primary episodes of August 14 and August 24, 2020.    Plan:     The patient has had no prior antibiotic exposure in the preceding 6 months.  No exposure to ill family members.  He does work in Engineer, petroleum with kindergartners, and is careful about hand hygiene (alcohol base).  He is regaining his strength and his weight is trending back to baseline.  He is a candidate for screening colonoscopy based on age, and with his recent GI symptoms a few weeks delay will be appropriate to allow the acute findings to resolve and any findings present to be of clinical significance.  He has been asked to call if he has recurrent symptoms between now and August 15, the date of his scheduled procedure and we will plan for a CT with contrast at that time.     Entered by Wendall Stade, CMA, acting as a scribe for Dr. Donnalee Curry, MD.   The documentation recorded by the scribe accurately reflects the service I personally performed and the decisions made by me.   Earline Mayotte, MD FACS

## 2020-09-14 ENCOUNTER — Other Ambulatory Visit: Payer: Self-pay | Admitting: General Surgery

## 2020-09-14 DIAGNOSIS — R109 Unspecified abdominal pain: Secondary | ICD-10-CM

## 2020-09-14 NOTE — Progress Notes (Signed)
ct 

## 2020-09-15 ENCOUNTER — Ambulatory Visit
Admission: RE | Admit: 2020-09-15 | Discharge: 2020-09-15 | Disposition: A | Discharge status: Home / Self Care | Payer: BC Managed Care – PPO | Source: Ambulatory Visit | Attending: General Surgery | Admitting: General Surgery

## 2020-09-15 ENCOUNTER — Other Ambulatory Visit: Payer: Self-pay

## 2020-09-15 DIAGNOSIS — R109 Unspecified abdominal pain: Secondary | ICD-10-CM | POA: Diagnosis not present

## 2020-09-15 MED ORDER — IOHEXOL 300 MG/ML  SOLN
100.0000 mL | Freq: Once | INTRAMUSCULAR | Status: AC | PRN
  Administered 2020-09-15: 100 mL via INTRAVENOUS

## 2020-10-04 ENCOUNTER — Other Ambulatory Visit: Payer: Self-pay

## 2020-10-04 ENCOUNTER — Other Ambulatory Visit
Admission: RE | Admit: 2020-10-04 | Discharge: 2020-10-04 | Disposition: A | Discharge status: Home / Self Care | Payer: BC Managed Care – PPO | Source: Ambulatory Visit | Attending: General Surgery | Admitting: General Surgery

## 2020-10-04 DIAGNOSIS — Z01812 Encounter for preprocedural laboratory examination: Secondary | ICD-10-CM | POA: Diagnosis present

## 2020-10-04 DIAGNOSIS — Z20822 Contact with and (suspected) exposure to covid-19: Secondary | ICD-10-CM | POA: Insufficient documentation

## 2020-10-05 ENCOUNTER — Encounter: Payer: Self-pay | Admitting: General Surgery

## 2020-10-05 LAB — SARS CORONAVIRUS 2 (TAT 6-24 HRS): SARS Coronavirus 2: NEGATIVE

## 2020-10-06 ENCOUNTER — Encounter: Payer: Self-pay | Admitting: General Surgery

## 2020-10-06 ENCOUNTER — Other Ambulatory Visit: Payer: Self-pay

## 2020-10-06 ENCOUNTER — Ambulatory Visit: Payer: BC Managed Care – PPO | Admitting: Certified Registered"

## 2020-10-06 ENCOUNTER — Ambulatory Visit
Admission: RE | Admit: 2020-10-06 | Discharge: 2020-10-06 | Disposition: A | Discharge status: Home / Self Care | Payer: BC Managed Care – PPO | Attending: General Surgery | Admitting: General Surgery

## 2020-10-06 ENCOUNTER — Encounter
Admission: RE | Disposition: A | Discharge status: Home / Self Care | Payer: Self-pay | Source: Home / Self Care | Attending: General Surgery

## 2020-10-06 DIAGNOSIS — Z79899 Other long term (current) drug therapy: Secondary | ICD-10-CM | POA: Diagnosis not present

## 2020-10-06 DIAGNOSIS — Z1211 Encounter for screening for malignant neoplasm of colon: Secondary | ICD-10-CM | POA: Diagnosis present

## 2020-10-06 DIAGNOSIS — Z880 Allergy status to penicillin: Secondary | ICD-10-CM | POA: Diagnosis not present

## 2020-10-06 HISTORY — DX: Sciatica, unspecified side: M54.30

## 2020-10-06 HISTORY — DX: Diverticulitis of intestine, part unspecified, without perforation or abscess without bleeding: K57.92

## 2020-10-06 HISTORY — DX: Personal history of urinary calculi: Z87.442

## 2020-10-06 HISTORY — PX: COLONOSCOPY WITH PROPOFOL: SHX5780

## 2020-10-06 HISTORY — DX: Other bacterial infections of unspecified site: A49.8

## 2020-10-06 SURGERY — COLONOSCOPY WITH PROPOFOL
Anesthesia: General

## 2020-10-06 MED ORDER — PROPOFOL 500 MG/50ML IV EMUL
INTRAVENOUS | Status: DC | PRN
  Administered 2020-10-06: 125 ug/kg/min via INTRAVENOUS

## 2020-10-06 MED ORDER — SODIUM CHLORIDE 0.9 % IV SOLN: INTRAVENOUS | Status: DC

## 2020-10-06 MED ORDER — LIDOCAINE HCL (CARDIAC) PF 100 MG/5ML IV SOSY
PREFILLED_SYRINGE | INTRAVENOUS | Status: DC | PRN
  Administered 2020-10-06: 60 mg via INTRAVENOUS

## 2020-10-06 MED ORDER — PROPOFOL 500 MG/50ML IV EMUL
INTRAVENOUS | Status: AC
  Filled 2020-10-06: qty 50

## 2020-10-06 MED ORDER — PROPOFOL 10 MG/ML IV BOLUS
INTRAVENOUS | Status: DC | PRN
  Administered 2020-10-06: 70 mg via INTRAVENOUS
  Administered 2020-10-06: 30 mg via INTRAVENOUS

## 2020-10-06 NOTE — Anesthesia Preprocedure Evaluation (Signed)
Anesthesia Evaluation  Patient identified by MRN, date of birth, ID band Patient awake    Reviewed: Allergy & Precautions, NPO status , Patient's Chart, lab work & pertinent test results  History of Anesthesia Complications Negative for: history of anesthetic complications  Airway Mallampati: II       Dental   Pulmonary neg sleep apnea, neg COPD, Not current smoker,           Cardiovascular (-) hypertension(-) Past MI and (-) CHF (-) dysrhythmias (-) Valvular Problems/Murmurs     Neuro/Psych neg Seizures  Neuromuscular disease (sciatica)    GI/Hepatic Neg liver ROS, neg GERD  ,  Endo/Other  neg diabetes  Renal/GU negative Renal ROS     Musculoskeletal   Abdominal   Peds  Hematology   Anesthesia Other Findings   Reproductive/Obstetrics                             Anesthesia Physical Anesthesia Plan  ASA: II  Anesthesia Plan: General   Post-op Pain Management:    Induction: Intravenous  PONV Risk Score and Plan: 2 and Propofol infusion and TIVA  Airway Management Planned: Nasal Cannula  Additional Equipment:   Intra-op Plan:   Post-operative Plan:   Informed Consent: I have reviewed the patients History and Physical, chart, labs and discussed the procedure including the risks, benefits and alternatives for the proposed anesthesia with the patient or authorized representative who has indicated his/her understanding and acceptance.       Plan Discussed with:   Anesthesia Plan Comments:         Anesthesia Quick Evaluation

## 2020-10-06 NOTE — Anesthesia Postprocedure Evaluation (Signed)
Anesthesia Post Note  Patient: Tanner Scott  Procedure(s) Performed: COLONOSCOPY WITH PROPOFOL (N/A )  Patient location during evaluation: Endoscopy Anesthesia Type: General Level of consciousness: awake and alert Pain management: pain level controlled Vital Signs Assessment: post-procedure vital signs reviewed and stable Respiratory status: spontaneous breathing and respiratory function stable Cardiovascular status: stable Anesthetic complications: no   No complications documented.   Last Vitals:  Vitals:   10/06/20 0803 10/06/20 0813  BP: (!) 105/94 105/80  Pulse: 91 75  Resp: 19 (!) 25  Temp:    SpO2: 97% 100%    Last Pain:  Vitals:   10/06/20 0813  TempSrc:   PainSc: 0-No pain                 Montell Leopard K

## 2020-10-06 NOTE — Transfer of Care (Signed)
Immediate Anesthesia Transfer of Care Note  Patient: Tanner Scott  Procedure(s) Performed: COLONOSCOPY WITH PROPOFOL (N/A )  Patient Location: Endoscopy Unit  Anesthesia Type:General  Level of Consciousness: awake and drowsy  Airway & Oxygen Therapy: Patient Spontanous Breathing  Post-op Assessment: Report given to RN and Post -op Vital signs reviewed and stable  Post vital signs: Reviewed and stable  Last Vitals:  Vitals Value Taken Time  BP    Temp    Pulse    Resp    SpO2      Last Pain:  Vitals:   10/06/20 0803  TempSrc:   PainSc: 0-No pain         Complications: No complications documented.

## 2020-10-06 NOTE — Op Note (Signed)
[facility] Patient Name: Tanner Scott Procedure Date: 10/06/2020 7:31 AM MRN: [id number] Account #: [id number] Date of Birth: 10-05-1971 Admit Type: Outpatient Age: 49 Room: [facility] ENDO ROOM 1 Gender: Male Note Status: Finalized Procedure:             Colonoscopy Indications:           Screening for colorectal malignant neoplasm Providers:             Earline Mayotte, MD Medicines:             Monitored Anesthesia Care Complications:         No immediate complications. Procedure:             Pre-Anesthesia Assessment:                        - Prior to the procedure, a History and Physical was                         performed, and patient medications, allergies and                         sensitivities were reviewed. The patient's tolerance                         of previous anesthesia was reviewed.                        - The risks and benefits of the procedure and the                         sedation options and risks were discussed with the                         patient. All questions were answered and informed                         consent was obtained.                        After obtaining informed consent, the colonoscope was                         passed under direct vision. Throughout the procedure,                         the patient's blood pressure, pulse, and oxygen                         saturations were monitored continuously. The was                         introduced through the anus and advanced to the the                         cecum, identified by appendiceal orifice and ileocecal                         valve. The colonoscopy was somewhat difficult due to a  tortuous colon. Successful completion of the procedure                         was aided by using manual pressure. The patient                         tolerated the procedure well. The quality of the bowel                          preparation was excellent.                        Biopsies of the ascending and descending colon were                         completed because of two prior episodes of C diff                         colitis. Findings:      The entire examined colon appeared normal on direct and retroflexion       views. Impression:            - The entire examined colon is normal on direct and                         retroflexion views.                        - No specimens collected. Recommendation:        - Repeat colonoscopy in 10 years for screening                         purposes.                        - Telephone endoscopist for pathology results in 1                         week. Procedure Code(s):     --- Professional ---                        [phone], Colonoscopy, flexible; diagnostic, including                         collection of specimen(s) by brushing or washing, when                         performed (separate procedure) Diagnosis Code(s):     --- Professional ---                        Z12.11, Encounter for screening for malignant neoplasm                         of colon CPT copyright 2019 American Medical Association. All rights reserved. The codes documented in this report are preliminary and upon coder review may  be revised to meet current compliance requirements. Earline Mayotte, MD 10/06/2020 8:02:01 AM This report has been signed electronically. Number of Addenda: 0 Note Initiated On: 10/06/2020 7:31 AM Scope Withdrawal  Time: 0 hours 15 minutes 17 seconds  Total Procedure Duration: 0 hours 23 minutes 10 seconds  Estimated Blood Loss:  Estimated blood loss: none.      [facility]

## 2020-10-06 NOTE — H&P (Signed)
Tanner Scott [id number] 04-08-1971     HPI: No change in clinical history or exam. Tolerated prep well. No recurrent episodes of diarrhea.   Medications Prior to Admission  Medication Sig Dispense Refill Last Dose  . Ascorbic Acid (VITAMIN C PO) Take by mouth.   Past Week at Unknown time  . atorvastatin (LIPITOR) 10 MG tablet Take 10 mg by mouth daily.     . fexofenadine (ALLEGRA) 180 MG tablet Take 180 mg by mouth daily.     . Omega-3 Fatty Acids (FISH OIL PO) Take by mouth.   Past Week at Unknown time  . sertraline (ZOLOFT) 50 MG tablet Take 75 mg by mouth daily.   Past Week at Unknown time  . Cholecalciferol (VITAMIN D-3 PO) Take by mouth.     . Coenzyme Q10 (CO Q-10) 100 MG CAPS Take by mouth.     . Multiple Vitamin (MULTIVITAMIN) capsule Take 1 capsule by mouth daily.     Marland Kitchen oxyCODONE-acetaminophen (PERCOCET) 7.5-325 MG per tablet Take 1 tablet by mouth every 4 (four) hours as needed for severe pain. 30 tablet 0   . Red Yeast Rice 600 MG TABS Take by mouth.     . sulfamethoxazole-trimethoprim (BACTRIM DS,SEPTRA DS) 800-160 MG per tablet Take 1 tablet by mouth 2 (two) times daily. 20 tablet 0    Allergies  Allergen Reactions  . Penicillins Rash   Past Medical History:  Diagnosis Date  . Clostridium difficile infection   . Diverticulitis   . Headache    sinus  . History of kidney stones   . Sciatica   . Sciatica    Past Surgical History:  Procedure Laterality Date  . LITHOTRIPSY    . SEPTOPLASTY Bilateral 08/04/2015   Procedure: SEPTOPLASTY B SMR TURBINATES;  Surgeon: Linus Salmons, MD;  Location: [facility];  Service: ENT;  Laterality: Bilateral;  . TYMPANOSTOMY TUBE PLACEMENT    . WISDOM TOOTH EXTRACTION     Social History   Socioeconomic History  . Marital status: Married    Spouse name: Not on file  . Number of children: Not on file  . Years of education: Not on file  . Highest education level: Not on file  Occupational History  . Not on file   Tobacco Use  . Smoking status: Never Smoker  . Smokeless tobacco: Never Used  Substance and Sexual Activity  . Alcohol use: Yes    Alcohol/week: 2.0 standard drinks    Types: 1 Glasses of wine, 1 Cans of beer per week  . Drug use: Not Currently  . Sexual activity: Not on file  Other Topics Concern  . Not on file  Social History Narrative  . Not on file   Social Determinants of Health   Financial Resource Strain:   . Difficulty of Paying Living Expenses: Not on file  Food Insecurity:   . Worried About Programme researcher, broadcasting/film/video in the Last Year: Not on file  . Ran Out of Food in the Last Year: Not on file  Transportation Needs:   . Lack of Transportation (Medical): Not on file  . Lack of Transportation (Non-Medical): Not on file  Physical Activity:   . Days of Exercise per Week: Not on file  . Minutes of Exercise per Session: Not on file  Stress:   . Feeling of Stress : Not on file  Social Connections:   . Frequency of Communication with Friends and Family: Not on file  . Frequency of Social  Gatherings with Friends and Family: Not on file  . Attends Religious Services: Not on file  . Active Member of Clubs or Organizations: Not on file  . Attends Banker Meetings: Not on file  . Marital Status: Not on file  Intimate Partner Violence:   . Fear of Current or Ex-Partner: Not on file  . Emotionally Abused: Not on file  . Physically Abused: Not on file  . Sexually Abused: Not on file   Social History   Social History Narrative  . Not on file     ROS: Negative.     PE: HEENT: Negative. Lungs: Clear. Cardio: RR.Marland Kitchen  Assessment/Plan:  Proceed with planned endoscopy.  Merrily Pew Tanner Scott 10/06/2020

## 2020-10-06 NOTE — Progress Notes (Signed)
   10/06/20 0745  Clinical Encounter Type  Visited With Family  Visit Type Initial  Referral From Chaplain  Consult/Referral To Chaplain  While rounding [facility] waiting area, chaplain spoke to Pt's mother to find out how she was doing. She said she was doing fine and she was reading. His mother talked about how she enjoys reading.

## 2020-10-06 NOTE — Anesthesia Procedure Notes (Signed)
Date/Time: 10/06/2020 7:30 AM Performed by: Elmarie Mainland, CRNA Pre-anesthesia Checklist: Patient identified, Emergency Drugs available, Patient being monitored and Suction available Oxygen Delivery Method: Nasal cannula

## 2020-10-09 ENCOUNTER — Encounter: Payer: Self-pay | Admitting: General Surgery

## 2020-10-09 LAB — SURGICAL PATHOLOGY

## 2021-10-11 IMAGING — CT CT ABD-PELV W/ CM
2 of 5 series · 15 of 46 positions shown, 17 images · IV contrast (omnipaque)
Comparison: None.

CLINICAL DATA: Abdominal pain, acute, nonlocalized.

EXAM:
CT ABDOMEN AND PELVIS WITH CONTRAST
TECHNIQUE: Multidetector CT imaging of the abdomen and pelvis was performed
using the standard protocol following bolus administration of
intravenous contrast.
CONTRAST:  100mL OMNIPAQUE IOHEXOL 300 MG/ML  SOLN

[Series 2: abd pelvis 5.00 · axial · 0.64mm/px · z∈[-1582,-1157]mm · 12 of 95 slices shown, 14 images]
[im 5/95  soft-tissue]
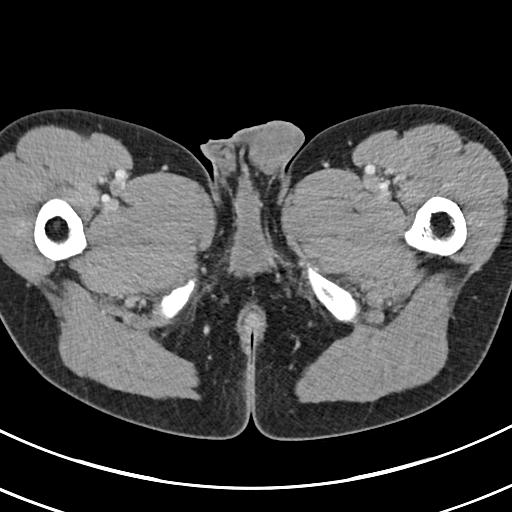
[im 5/95  bone]
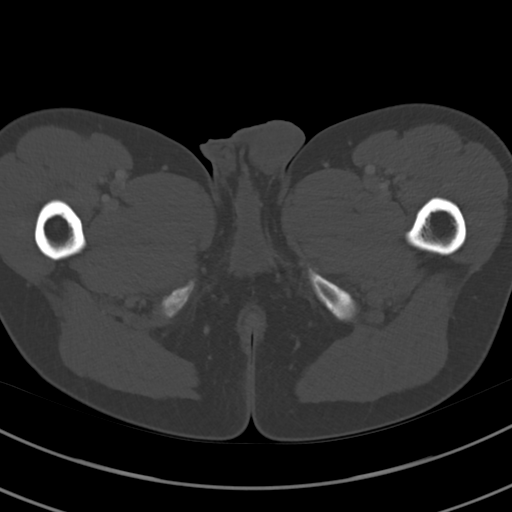
[im 15/95  soft-tissue]
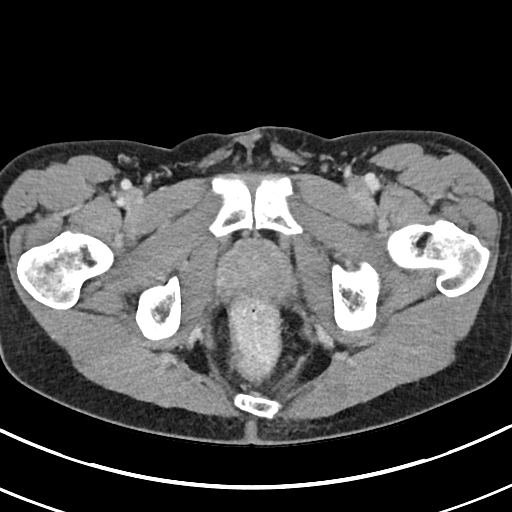
[im 20/95  soft-tissue]
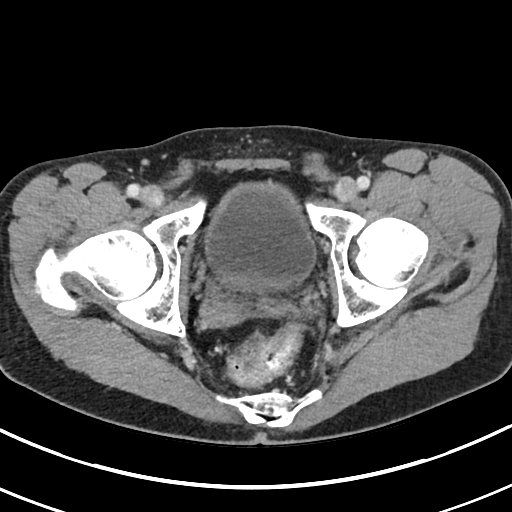
[im 30/95  soft-tissue]
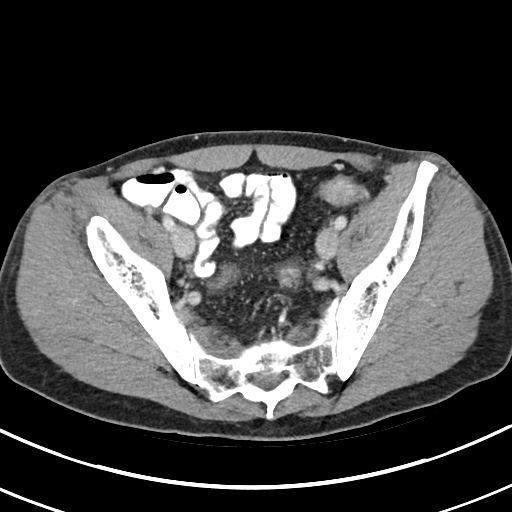
[im 35/95  soft-tissue]
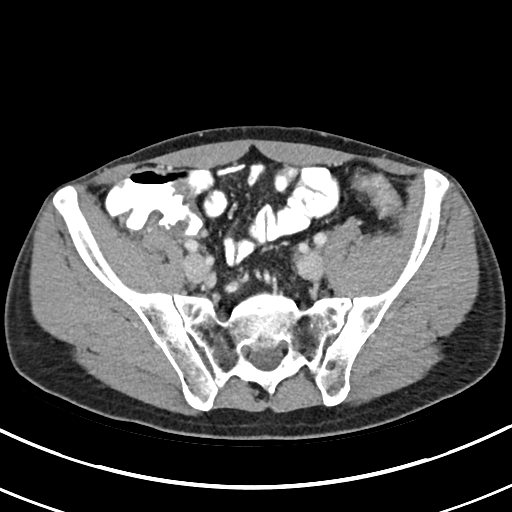
[im 45/95  soft-tissue]
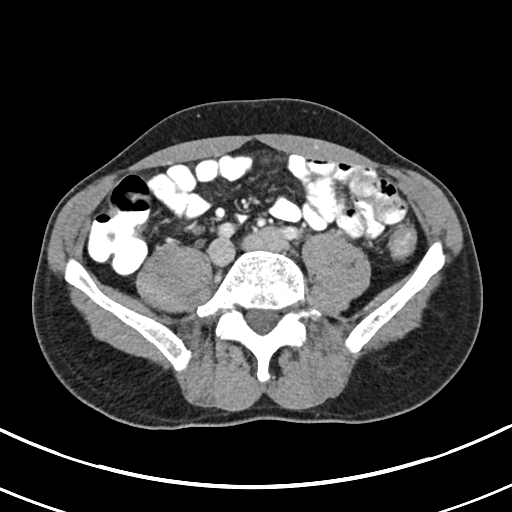
[im 50/95  soft-tissue]
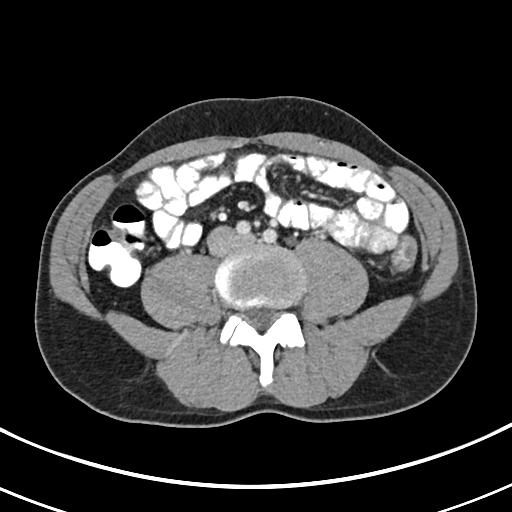
[im 60/95  soft-tissue]
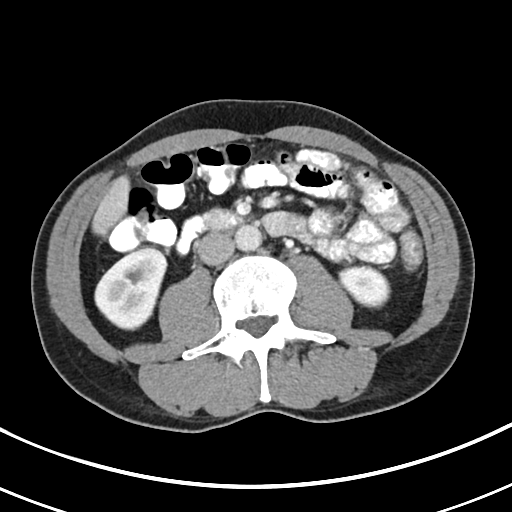
[im 65/95  soft-tissue]
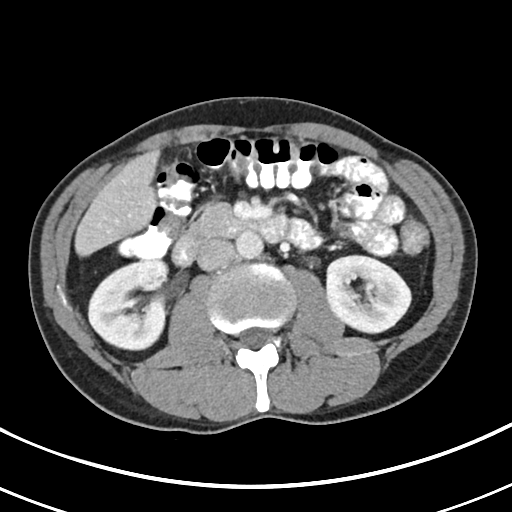
[im 65/95  bone]
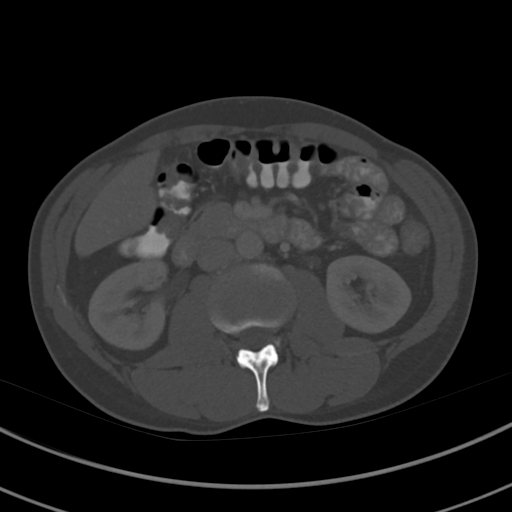
[im 75/95  soft-tissue]
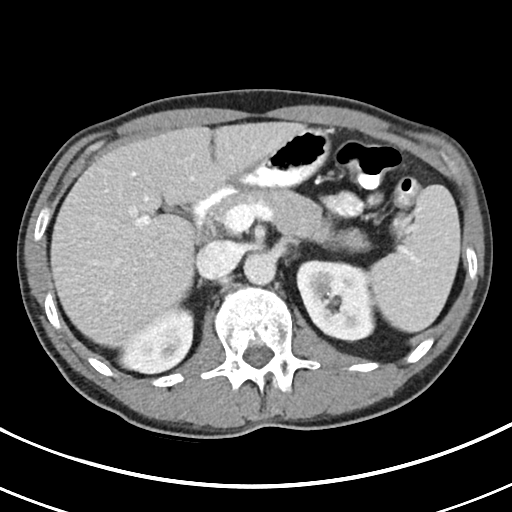
[im 80/95  soft-tissue]
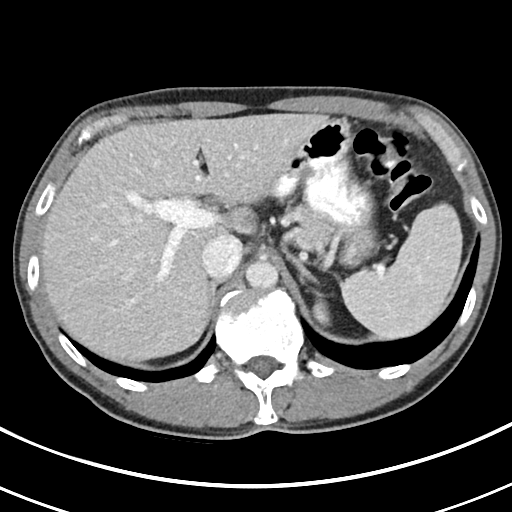
[im 90/95  soft-tissue]
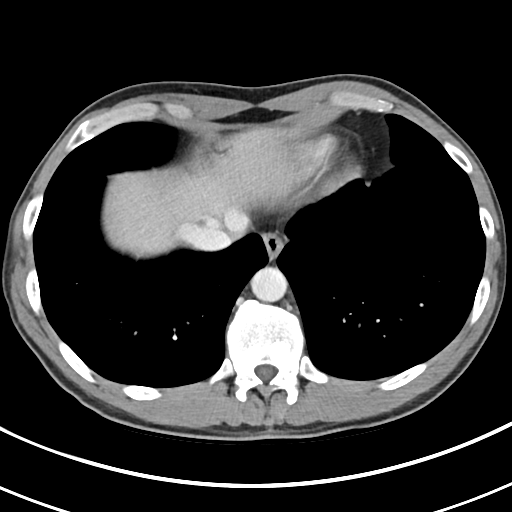

[Series 4: coronals abd pelvis 2.00 cor · coronal · 0.64mm/px · 3 of 121 slices shown]
[im 41/121  soft-tissue]
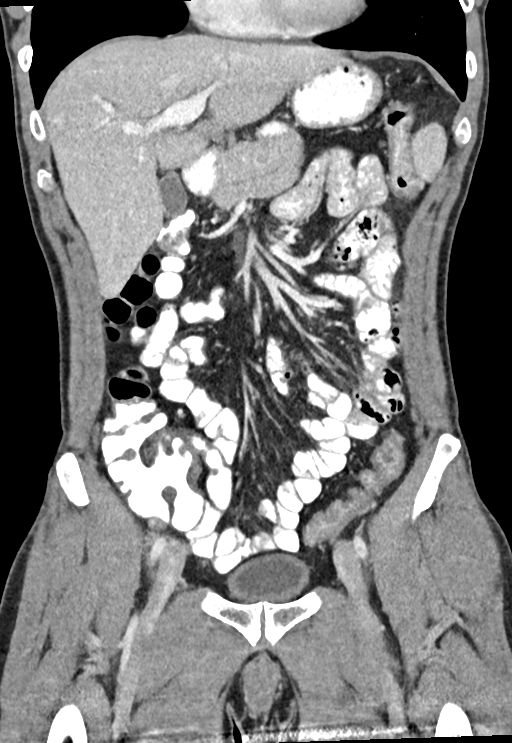
[im 54/121  soft-tissue]
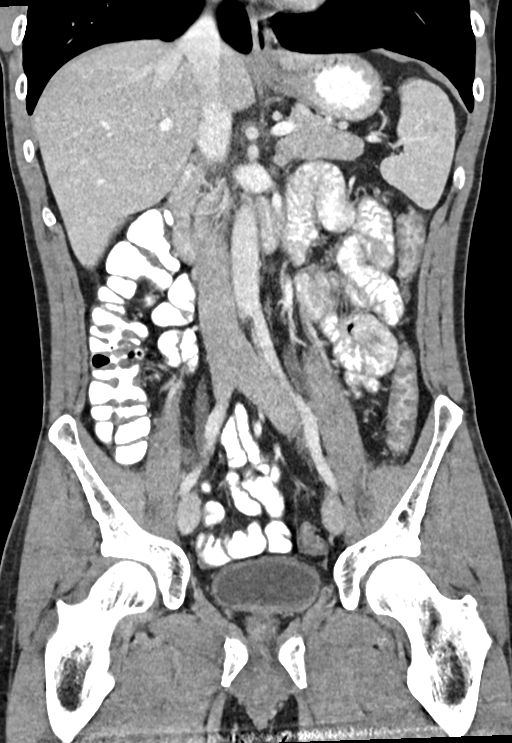
[im 67/121  soft-tissue]
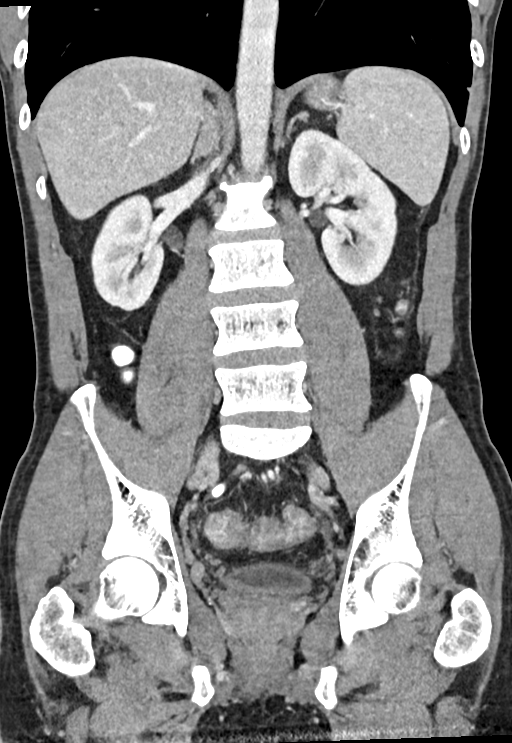

[15 of 46 positions shown; findings below may reference images not displayed]

FINDINGS: Lower chest: The lung bases are clear without focal nodule, mass, or
airspace disease.

Hepatobiliary: Mild fatty infiltration is present. No discrete
lesions are present. Common bile duct and gallbladder are within
normal limits.

Pancreas: Unremarkable. No pancreatic ductal dilatation or
surrounding inflammatory changes.

Spleen: Normal in size without focal abnormality.

Adrenals/Urinary Tract: Adrenal glands are within normal limits. 8
mm simple cyst is present at the upper pole of the right kidney. No
solid lesions are present. Kidneys and ureters are otherwise within
normal limits. No stone or obstruction is present. Distal ureters
are within normal limits. There is some wall thickening about the
urinary bladder without a discrete lesion.

Stomach/Bowel: The stomach and duodenum are within limits for a
small bowel is unremarkable. Mild thickening is present at the
terminal ileum. Appendix is visualized and normal. Scattered areas
of mucosal thickening are present in the transverse colon. Diffuse
mucosal thickening is present in the descending and sigmoid colon.
Minimal Peri colonic stranding is present. No free air or abscess is
present.

Vascular/Lymphatic: No significant vascular findings are present. No
enlarged abdominal or pelvic lymph nodes.

Reproductive: Prostate is unremarkable.

Other: No abdominal wall hernia or abnormality. No abdominopelvic
ascites.

Musculoskeletal: Prominent Schmorl's nodes are present in the lower
or thoracic spine. Vertebral body heights are maintained. Rightward
curvature of the thoracolumbar spine is centered at L1-2. Focal
lytic or blastic lesions present.
IMPRESSION: 1. Diffuse mucosal thickening in the descending and sigmoid colon
compatible with a nonspecific colitis. C. diff colitis can have this
appearance.
2. No free air or abscess is present.
3. Mild thickening at the terminal ileum may be related to
infectious disease. Inflammatory bowel disease is also considered.
4. Hepatic steatosis.
5. Rightward curvature of the thoracolumbar spine is centered at
L1-2.

## 2022-11-05 LAB — LAB REPORT - SCANNED
A1c: 5.9
EGFR: 107

## 2023-05-20 ENCOUNTER — Other Ambulatory Visit: Payer: Self-pay

## 2023-05-29 ENCOUNTER — Ambulatory Visit: Payer: BC Managed Care – PPO | Admitting: Physician Assistant

## 2023-11-04 LAB — LAB REPORT - SCANNED: EGFR: 107

## 2024-05-11 LAB — LAB REPORT - SCANNED
A1c: 5.5
EGFR: 105

## 2024-07-28 ENCOUNTER — Encounter: Payer: Self-pay | Admitting: Family Medicine

## 2024-07-28 ENCOUNTER — Ambulatory Visit: Payer: Self-pay | Admitting: Family Medicine

## 2024-07-28 VITALS — BP 121/84 | HR 86 | Resp 16 | Ht 70.0 in | Wt 165.3 lb

## 2024-07-28 DIAGNOSIS — F411 Generalized anxiety disorder: Secondary | ICD-10-CM | POA: Diagnosis not present

## 2024-07-28 DIAGNOSIS — Z1159 Encounter for screening for other viral diseases: Secondary | ICD-10-CM

## 2024-07-28 DIAGNOSIS — M5432 Sciatica, left side: Secondary | ICD-10-CM

## 2024-07-28 DIAGNOSIS — R519 Headache, unspecified: Secondary | ICD-10-CM | POA: Diagnosis not present

## 2024-07-28 DIAGNOSIS — N3943 Post-void dribbling: Secondary | ICD-10-CM

## 2024-07-28 DIAGNOSIS — Z862 Personal history of diseases of the blood and blood-forming organs and certain disorders involving the immune mechanism: Secondary | ICD-10-CM

## 2024-07-28 DIAGNOSIS — Z23 Encounter for immunization: Secondary | ICD-10-CM

## 2024-07-28 DIAGNOSIS — E782 Mixed hyperlipidemia: Secondary | ICD-10-CM | POA: Diagnosis not present

## 2024-07-28 DIAGNOSIS — H6123 Impacted cerumen, bilateral: Secondary | ICD-10-CM

## 2024-07-28 DIAGNOSIS — Z7689 Persons encountering health services in other specified circumstances: Secondary | ICD-10-CM

## 2024-07-28 NOTE — Progress Notes (Signed)
 Established Patient Office Visit  Introduced to nurse practitioner role and practice setting.  All questions answered.  Discussed provider/patient relationship and expectations.  Subjective   Patient ID: Tanner Scott, male    DOB: 1971/03/07  Age: 53 y.o. MRN: [id number]  Chief Complaint  Patient presents with   Establish Care    Last PCP: Dr. Corlis Last colonoscopy: 10/06/2020   Anxiety    Discuss continuous of zoloft   Hyperlipidemia    Discuss atorvastin   Discussed the use of AI scribe software for clinical note transcription with the patient, who gave verbal consent to proceed.  History of Present Illness [name] is a 53 year old male who presents for a routine follow-up visit.  Anxiety - Managed with sertraline 75 mg daily - No mention of breakthrough symptoms or side effects  Hyperlipidemia - Managed with atorvastatin (Lipitor) 10 mg daily - No mention of adverse effects  HX of C.diff infection - No recurrence since completing vancomycin treatment - Takes a daily probiotic for prevention  Sciatica - Intermittent left-sided symptoms - Managed with Advil and rest as needed  Sinus headaches - Occasional episodes - sees ENT d/x hx of chronic cerumen impaction  Allergic rhinitis - Takes fexofenadine 180 mg daily for seasonal allergies - History of allergy shots for five years  Iron deficiency/ Anemia - Previously stopped donating blood due to low iron levels - Iron levels have since improved      07/28/2024   10:05 AM  Depression screen PHQ 2/9  Decreased Interest 0  Down, Depressed, Hopeless 1  PHQ - 2 Score 1  Altered sleeping 1  Tired, decreased energy 0  Change in appetite 0  Feeling bad or failure about yourself  1  Trouble concentrating 0  Moving slowly or fidgety/restless 0  Suicidal thoughts 0  PHQ-9 Score 3  Difficult doing work/chores Somewhat difficult       07/28/2024   10:05 AM  GAD 7 : Generalized Anxiety Score   Nervous, Anxious, on Edge 2  Control/stop worrying 1  Worry too much - different things 1  Trouble relaxing 1  Restless 1  Easily annoyed or irritable 0  Afraid - awful might happen 0  Total GAD 7 Score 6  Anxiety Difficulty Somewhat difficult     ROS  Negative unless indicated in HPI   Objective:     BP 121/84 (BP Location: Right Arm, Patient Position: Sitting, Cuff Size: Normal)   Pulse 86   Resp 16   Ht 5' 10 (1.778 m)   Wt 165 lb 4.8 oz (75 kg)   SpO2 98%   BMI 23.72 kg/m    Physical Exam Constitutional:      General: He is not in acute distress.    Appearance: Normal appearance. He is normal weight. He is not ill-appearing, toxic-appearing or diaphoretic.  HENT:     Head: Normocephalic.     Right Ear: Tympanic membrane is scarred.     Left Ear: Tympanic membrane is scarred.     Ears:     Comments: Dry ear canals    Nose: Nose normal.     Mouth/Throat:     Mouth: Mucous membranes are moist.  Eyes:     Extraocular Movements: Extraocular movements intact.     Conjunctiva/sclera: Conjunctivae normal.     Pupils: Pupils are equal, round, and reactive to light.  Neck:     Vascular: No carotid bruit.  Cardiovascular:  Rate and Rhythm: Normal rate and regular rhythm.     Pulses: Normal pulses.     Heart sounds: Normal heart sounds. No murmur heard.    No friction rub. No gallop.  Pulmonary:     Effort: Pulmonary effort is normal. No respiratory distress.     Breath sounds: Normal breath sounds. No stridor. No wheezing, rhonchi or rales.  Chest:     Chest wall: No tenderness.  Musculoskeletal:     Right lower leg: No edema.     Left lower leg: No edema.  Lymphadenopathy:     Cervical: No cervical adenopathy.  Skin:    General: Skin is warm and dry.     Capillary Refill: Capillary refill takes less than 2 seconds.  Neurological:     General: No focal deficit present.     Mental Status: He is alert and oriented to person, place, and time. Mental  status is at baseline.     Cranial Nerves: No cranial nerve deficit.     Sensory: No sensory deficit.     Motor: No weakness.     Coordination: Coordination normal.     Gait: Gait normal.  Psychiatric:        Attention and Perception: Attention and perception normal.        Mood and Affect: Mood normal.        Behavior: Behavior normal. Behavior is cooperative.        Thought Content: Thought content normal.        Cognition and Memory: Cognition and memory normal.        Judgment: Judgment normal.      No results found for any visits on 07/28/24.    The ASCVD Risk score (Arnett DK, et al., 2019) failed to calculate for the following reasons:   Cannot find a previous HDL lab   Cannot find a previous total cholesterol lab    Assessment & Plan:  GAD (generalized anxiety disorder) -     TSH  Mixed hyperlipidemia -     Comprehensive metabolic panel with GFR -     Lipid panel  Post-void dribbling  Headache disorder  Left sided sciatica  Immunization due -     Pneumococcal conjugate vaccine 20-valent  History of anemia -     CBC  Screening for viral disease -     HIV Antibody (routine testing w rflx) -     Hepatitis C antibody  Encounter to establish care with new doctor  Bilateral impacted cerumen  Other orders -     Tdap vaccine greater than or equal to 7yo IM     Assessment and Plan Assessment & Plan GAD - Continue sertraline 75 mg daily. - Schedule follow-up in 6 months for mood check, virtual visit acceptable.  Hyperlipidemia - Continue Lipitor 10 mg daily. - Order lipid panel to assess current cholesterol levels.  Left-sided sciatica Intermittent left-sided sciatica, managed conservatively with advil, heat, ice rest with flare ups.  Sinus/Migraine headaches - L frontal - Advise to monitor for any changes in headache pattern, severity, or associated symptoms. - Continue use of advil, dark room, and naps for mgmt  Mild post-void dribbling -  Uses saw palmetto supplementation - states helpful - denies clotting disorders  Hx of Anemia - check CBC today - anemia d/t too many blood donations per patient  Tdap and Pneumococcal Vaccines Due - Administered in clinic today  Chronic Cerumen Impaction Bilateral - sees ENT for removal - dry skin  in ear canals today, Tms scarred, otherwise no concerns  Return in about 6 months (around 01/28/2025), or mood - virtual.   I, Curtis DELENA Boom, FNP, have reviewed all documentation for this visit. The documentation on 07/28/24 for the exam, diagnosis, procedures, and orders are all accurate and complete.  Curtis DELENA Boom, FNP

## 2024-07-29 ENCOUNTER — Ambulatory Visit: Payer: Self-pay | Admitting: Family Medicine

## 2024-07-29 LAB — COMPREHENSIVE METABOLIC PANEL WITH GFR
ALT: 24 IU/L (ref 0–44)
AST: 24 IU/L (ref 0–40)
Albumin: 4.9 g/dL (ref 3.8–4.9)
Alkaline Phosphatase: 99 IU/L (ref 44–121)
BUN/Creatinine Ratio: 15 (ref 9–20)
BUN: 13 mg/dL (ref 6–24)
Bilirubin Total: 0.5 mg/dL (ref 0.0–1.2)
CO2: 23 mmol/L (ref 20–29)
Calcium: 10.1 mg/dL (ref 8.7–10.2)
Chloride: 100 mmol/L (ref 96–106)
Creatinine, Ser: 0.88 mg/dL (ref 0.76–1.27)
Globulin, Total: 2.5 g/dL (ref 1.5–4.5)
Glucose: 64 mg/dL — ABNORMAL LOW (ref 70–99)
Potassium: 4.7 mmol/L (ref 3.5–5.2)
Sodium: 138 mmol/L (ref 134–144)
Total Protein: 7.4 g/dL (ref 6.0–8.5)
eGFR: 103 mL/min/1.73 (ref >59)

## 2024-07-29 LAB — LIPID PANEL
Chol/HDL Ratio: 3.2 ratio (ref 0.0–5.0)
Cholesterol, Total: 197 mg/dL (ref 100–199)
HDL: 62 mg/dL (ref >39)
LDL Chol Calc (NIH): 118 mg/dL — ABNORMAL HIGH (ref 0–99)
Triglycerides: 95 mg/dL (ref 0–149)
VLDL Cholesterol Cal: 17 mg/dL (ref 5–40)

## 2024-07-29 LAB — CBC
Hematocrit: 48.4 % (ref 37.5–51.0)
Hemoglobin: 16 g/dL (ref 13.0–17.7)
MCH: 30.4 pg (ref 26.6–33.0)
MCHC: 33.1 g/dL (ref 31.5–35.7)
MCV: 92 fL (ref 79–97)
Platelets: 280 x10E3/uL (ref 150–450)
RBC: 5.27 x10E6/uL (ref 4.14–5.80)
RDW: 12.6 % (ref 11.6–15.4)
WBC: 4.4 x10E3/uL (ref 3.4–10.8)

## 2024-07-29 LAB — HIV ANTIBODY (ROUTINE TESTING W REFLEX): HIV Screen 4th Generation wRfx: NONREACTIVE

## 2024-07-29 LAB — HEPATITIS C ANTIBODY: Hep C Virus Ab: NONREACTIVE

## 2024-07-29 LAB — TSH: TSH: 1.47 u[IU]/mL (ref 0.450–4.500)

## 2024-11-02 ENCOUNTER — Encounter: Payer: Self-pay | Admitting: Family Medicine

## 2024-11-02 ENCOUNTER — Ambulatory Visit: Admitting: Family Medicine

## 2024-11-02 VITALS — BP 121/93 | HR 80 | Ht 70.0 in | Wt 166.0 lb

## 2024-11-02 DIAGNOSIS — J309 Allergic rhinitis, unspecified: Secondary | ICD-10-CM | POA: Insufficient documentation

## 2024-11-02 DIAGNOSIS — R030 Elevated blood-pressure reading, without diagnosis of hypertension: Secondary | ICD-10-CM

## 2024-11-02 DIAGNOSIS — Z789 Other specified health status: Secondary | ICD-10-CM

## 2024-11-02 DIAGNOSIS — Z8249 Family history of ischemic heart disease and other diseases of the circulatory system: Secondary | ICD-10-CM | POA: Diagnosis not present

## 2024-11-02 DIAGNOSIS — F411 Generalized anxiety disorder: Secondary | ICD-10-CM | POA: Diagnosis not present

## 2024-11-02 DIAGNOSIS — E782 Mixed hyperlipidemia: Secondary | ICD-10-CM | POA: Diagnosis not present

## 2024-11-02 DIAGNOSIS — G43009 Migraine without aura, not intractable, without status migrainosus: Secondary | ICD-10-CM

## 2024-11-02 MED ORDER — ATORVASTATIN CALCIUM 10 MG PO TABS: 10.0000 mg | ORAL_TABLET | Freq: Every day | ORAL | 1 refills | Status: AC

## 2024-11-02 NOTE — Progress Notes (Signed)
 Established patient visit   Patient: Tanner Scott   DOB: 12-27-1970   53 y.o. Male  MRN: [id number] Visit Date: 11/02/2024  Today's healthcare provider: LAURAINE LOISE BUOY, DO   Chief Complaint  Patient presents with   Transitions Of Care   Subjective    HPI Tanner Scott is a 53 year old male who presents for medication management and follow-up.  He is currently taking sertraline 75 mg for anxiety. He also takes atorvastatin  10 mg, with his last prescription being for six months. He is concerned about refills and ensuring continuity of care as his previous provider retired in June.  He has a history of sciatica for which he is taking gabapentin, having previously tried methocarbamol as recommended by an orthopedic specialist. He also uses over-the-counter Allegra for allergies and CoQ10, which was recommended when he started atorvastatin  to manage potential muscle aches.  He reports a family history of high blood pressure, with both parents affected and his father having died of a heart attack at 56. He is concerned about his own blood pressure, noting a recent diastolic reading of 93, which he has never experienced before. He monitors his blood pressure at home, usually finding it around 120/80.  He has a history of receiving allergy shots for five years but stopped as he felt better. He experiences headaches, which he initially thought were sinus-related but now attributes to stress, and reports that they tend to occur on the left side.  He is proactive about his health, engaging in regular walking for exercise and is open to incorporating more strength-based activities. He is mindful of his diet, preferring frozen vegetables for convenience and maintaining a Mediterranean-style diet with limited red meat.  He is uncertain about his hepatitis B vaccination status, recalling receiving vaccines possibly 20 years ago through school programs but is unsure of the  specifics.       Medications: Outpatient Medications Prior to Visit  Medication Sig   Cholecalciferol (VITAMIN D-3 PO) Take 1,000 Units by mouth daily.   Coenzyme Q10 (CO Q-10) 100 MG CAPS Take by mouth.   fexofenadine (ALLEGRA) 180 MG tablet Take 180 mg by mouth daily.   gabapentin (NEURONTIN) 100 MG capsule Take 100 mg by mouth.   Multiple Vitamin (MULTIVITAMIN) capsule Take 1 capsule by mouth daily.   Probiotic Product (PROBIOTIC ADVANCED PO) Take by mouth.   saw palmetto 500 MG capsule Take 540 mg by mouth daily.   sertraline (ZOLOFT) 50 MG tablet Take 75 mg by mouth daily.   [DISCONTINUED] atorvastatin  (LIPITOR) 10 MG tablet Take 10 mg by mouth daily.   No facility-administered medications prior to visit.    Review of Systems  Respiratory: Negative.  Negative for cough, shortness of breath and wheezing.   Cardiovascular:  Negative for chest pain, palpitations and leg swelling.  Gastrointestinal:  Negative for abdominal pain, constipation, diarrhea, nausea and vomiting.  Neurological:  Negative for weakness, numbness and headaches.        Objective    BP (!) 121/93   Pulse 80   Ht 5' 10 (1.778 m)   Wt 166 lb (75.3 kg)   SpO2 98%   BMI 23.82 kg/m     Physical Exam Vitals reviewed.  Constitutional:      General: He is not in acute distress.    Appearance: Normal appearance. He is not diaphoretic.  HENT:     Head: Normocephalic and atraumatic.  Eyes:  General: No scleral icterus.    Conjunctiva/sclera: Conjunctivae normal.  Cardiovascular:     Rate and Rhythm: Normal rate and regular rhythm.     Pulses: Normal pulses.     Heart sounds: Normal heart sounds. No murmur heard. Pulmonary:     Effort: Pulmonary effort is normal. No respiratory distress.     Breath sounds: Normal breath sounds. No wheezing or rhonchi.  Musculoskeletal:     Cervical back: Neck supple.     Right lower leg: No edema.     Left lower leg: No edema.  Lymphadenopathy:      Cervical: No cervical adenopathy.  Skin:    General: Skin is warm and dry.     Findings: No rash.  Neurological:     Mental Status: He is alert and oriented to person, place, and time. Mental status is at baseline.  Psychiatric:        Mood and Affect: Mood normal.        Behavior: Behavior normal.      Results for orders placed or performed in visit on 11/02/24  Lipid panel  Result Value Ref Range   Cholesterol, Total 173 100 - 199 mg/dL   Triglycerides 76 0 - 149 mg/dL   HDL 55 >60 mg/dL   VLDL Cholesterol Cal 14 5 - 40 mg/dL   LDL Chol Calc (NIH) 895 (H) 0 - 99 mg/dL   Chol/HDL Ratio 3.1 0.0 - 5.0 ratio  Hepatitis B Surface AntiBODY  Result Value Ref Range   Hep B Surface Ab, Qual Non Reactive     Assessment & Plan    GAD (generalized anxiety disorder)  Mixed hyperlipidemia -     Atorvastatin  Calcium ; Take 1 tablet (10 mg total) by mouth daily.  Dispense: 90 tablet; Refill: 1 -     Lipid panel -     CT CARDIAC SCORING (SELF PAY ONLY); Future  Borderline hypertension  Family history of premature coronary heart disease -     CT CARDIAC SCORING (SELF PAY ONLY); Future  Migraine without aura and without status migrainosus, not intractable  Hepatitis B vaccination status unknown -     Hepatitis B surface antibody,qualitative      GAD (generalized anxiety disorder) Well-controlled on sertraline 75 mg. No acute concerns.  Continue to monitor.   - Continue sertraline 75 mg daily.  Mixed hyperlipidemia Managed with atorvastatin  10 mg. LDL improved to 95-110. Discussed coronary artery calcium  scoring CT for plaque assessment and heart attack risk. Insurance may not cover, cost $100 out-of-pocket. - Continue atorvastatin  10 mg daily. - Ordered coronary artery calcium  scoring CT. - Ordered fasting lipid panel.  Borderline hypertension; family history of premature coronary artery disease Home readings 120/80 mmHg, in-office systolic  today was 93 mmHg. Patient's  father died of an MI at 29 Yo.  Discussed lifestyle modifications and cholesterol management as above.  Discussed risks and benefits of very low dose antihypertensive if preferred, to optimize blood pressure. Through shared decision making, not using antihypertensive unless consistent rise to 130s or higher. - Monitor blood pressure at home. - Encouraged regular exercise, including strength training. - Advised on heart-healthy diet.  Migraine without aura and without status migrainosus, not intractable. Unilateral headaches, possibly stress-related. No tearing or nasal congestion.  Not frequent enough for preventative medication; pt prefers current methods of managing his symptoms. - Continue current management.  General Health Maintenance Discussed hepatitis B vaccination status. Uncertain if series completed. Plan to check antibody levels. - Ordered  hepatitis B antibody test. - Ordered coronary artery calcium  scoring CT.    Return in about 7 months (around 06/06/2025) for CPE, Chronic f/u.      I discussed the assessment and treatment plan with the patient  The patient was provided an opportunity to ask questions and all were answered. The patient agreed with the plan and demonstrated an understanding of the instructions.   The patient was advised to call back or seek an in-person evaluation if the symptoms worsen or if the condition fails to improve as anticipated.    LAURAINE LOISE BUOY, DO  [facility] [phone] (phone) [phone] (fax)  [facility]

## 2024-11-03 LAB — HEPATITIS B SURFACE ANTIBODY,QUALITATIVE: Hep B Surface Ab, Qual: NONREACTIVE

## 2024-11-03 LAB — LIPID PANEL
Chol/HDL Ratio: 3.1 ratio (ref 0.0–5.0)
Cholesterol, Total: 173 mg/dL (ref 100–199)
HDL: 55 mg/dL (ref >39)
LDL Chol Calc (NIH): 104 mg/dL — ABNORMAL HIGH (ref 0–99)
Triglycerides: 76 mg/dL (ref 0–149)
VLDL Cholesterol Cal: 14 mg/dL (ref 5–40)

## 2024-11-15 ENCOUNTER — Ambulatory Visit
Admission: RE | Admit: 2024-11-15 | Discharge: 2024-11-15 | Disposition: A | Discharge status: Home / Self Care | Payer: Self-pay | Source: Ambulatory Visit | Attending: Family Medicine | Admitting: Family Medicine

## 2024-11-15 DIAGNOSIS — E782 Mixed hyperlipidemia: Secondary | ICD-10-CM | POA: Insufficient documentation

## 2024-11-15 DIAGNOSIS — Z8249 Family history of ischemic heart disease and other diseases of the circulatory system: Secondary | ICD-10-CM | POA: Insufficient documentation

## 2024-11-16 ENCOUNTER — Ambulatory Visit: Payer: Self-pay | Admitting: Family Medicine

## 2024-12-24 ENCOUNTER — Ambulatory Visit: Payer: Self-pay

## 2024-12-24 ENCOUNTER — Telehealth: Admitting: Physician Assistant

## 2024-12-24 DIAGNOSIS — J101 Influenza due to other identified influenza virus with other respiratory manifestations: Secondary | ICD-10-CM | POA: Diagnosis not present

## 2024-12-24 MED ORDER — LIDOCAINE VISCOUS HCL 2 % MT SOLN: 5.0000 mL | Freq: Four times a day (QID) | OROMUCOSAL | 0 refills | Status: AC | PRN

## 2024-12-24 MED ORDER — PROMETHAZINE-DM 6.25-15 MG/5ML PO SYRP: 5.0000 mL | ORAL_SOLUTION | Freq: Four times a day (QID) | ORAL | 0 refills | Status: AC | PRN

## 2024-12-24 MED ORDER — OSELTAMIVIR PHOSPHATE 75 MG PO CAPS: 75.0000 mg | ORAL_CAPSULE | Freq: Two times a day (BID) | ORAL | 0 refills | Status: AC

## 2024-12-24 NOTE — Telephone Encounter (Signed)
 FYI Only or Action Required?: FYI only for provider: appointment scheduled on virtual UC 12/24/24.  Patient was last seen in primary care on 11/02/2024 by Donzella Lauraine SAILOR, DO.  Called Nurse Triage reporting Influenza.  Symptoms began 1-2 days ago.  Interventions attempted: OTC medications: Theraflu and Advil.  Symptoms are: stable.  Triage Disposition: See Physician Within 24 Hours  Patient/caregiver understands and will follow disposition?: Yes                                  1. SYMPTOMS: What is your main symptom or concern? (e.g., cough, fever, shortness of breath, muscle aches)     Muscle aches, headache, fatigue, sore throat, left earache and congestion 2. ONSET: When did the symptoms start?      Wednesday night/Thursday morning 3. COUGH: Do you have a cough? If Yes, ask: How bad is the cough?       Yes, productive cough 4. FEVER: Do you have a fever? If Yes, ask: What is your temperature, how was it measured, and when did it start?     99.3, most recent  5. BREATHING DIFFICULTY: Are you having any difficulty breathing? (e.g., normal; shortness of breath, wheezing, unable to speak)      Denies, patient able to speak in clear and complete sentences while on phone with this RN 7. OTHER SYMPTOMS: Do you have any other symptoms?  (e.g., chills, fatigue, headache, loss of smell or taste, muscle pain, sore throat)     Denies chest pain, denies difficulty swallowing 8. INFLUENZA EXPOSURE: Was there any known exposure to influenza (flu) before the symptoms began?      Wife tested positive for flu B on Wednesday morning 11. HIGH RISK FOR COMPLICATIONS: Do you have any chronic medical problems? (e.g., asthma, heart or lung disease, obesity, weak immune system)      Patient denies    This RN advised evaluation today. No availability in PCP office or surrounding offices within region. This RN scheduled patient for virtual UC appointment  today.   Copied from CRM #[id number]. Topic: Clinical - Red Word Triage >> Dec 24, 2024  8:20 AM Tiffini S wrote: [facility] that prompted transfer to Nurse Triage:  sore throat, ear pain, body aches, cough/ sneezing Symptoms started yesterday morning- family members are positive for flu  Reason for Disposition  Earache  Protocols used: Influenza (Flu) Suspected-A-AH

## 2024-12-24 NOTE — Patient Instructions (Signed)
 " Tanner Scott, thank you for joining Tanner CHRISTELLA Dickinson, PA-C for today's virtual visit.  While this provider is not your primary care provider (PCP), if your PCP is located in our provider database this encounter information will be shared with them immediately following your visit.   A [name] account gives you access to today's visit and all your visits, tests, and labs performed at [facility]  click here if you don't have a [facility] MyChart account or go to [email or web address]  Consent: (Patient) Tanner Scott provided verbal consent for this virtual visit at the beginning of the encounter.  Current Medications:  Current Outpatient Medications:    lidocaine  (XYLOCAINE ) 2 % solution, Use as directed 5-10 mLs in the mouth or throat every 6 (six) hours as needed., Disp: 100 mL, Rfl: 0   oseltamivir (TAMIFLU) 75 MG capsule, Take 1 capsule (75 mg total) by mouth 2 (two) times daily., Disp: 10 capsule, Rfl: 0   promethazine-dextromethorphan (PROMETHAZINE-DM) 6.25-15 MG/5ML syrup, Take 5 mLs by mouth 4 (four) times daily as needed., Disp: 118 mL, Rfl: 0   atorvastatin  (LIPITOR) 10 MG tablet, Take 1 tablet (10 mg total) by mouth daily., Disp: 90 tablet, Rfl: 1   Cholecalciferol (VITAMIN D-3 PO), Take 1,000 Units by mouth daily., Disp: , Rfl:    Coenzyme Q10 (CO Q-10) 100 MG CAPS, Take by mouth., Disp: , Rfl:    fexofenadine (ALLEGRA) 180 MG tablet, Take 180 mg by mouth daily., Disp: , Rfl:    gabapentin (NEURONTIN) 100 MG capsule, Take 100 mg by mouth., Disp: , Rfl:    Multiple Vitamin (MULTIVITAMIN) capsule, Take 1 capsule by mouth daily., Disp: , Rfl:    Probiotic Product (PROBIOTIC ADVANCED PO), Take by mouth., Disp: , Rfl:    saw palmetto 500 MG capsule, Take 540 mg by mouth daily., Disp: , Rfl:    sertraline (ZOLOFT) 50 MG tablet, Take 75 mg by mouth daily., Disp: , Rfl:    Medications ordered in this encounter:  Meds ordered this encounter   Medications   oseltamivir (TAMIFLU) 75 MG capsule    Sig: Take 1 capsule (75 mg total) by mouth 2 (two) times daily.    Dispense:  10 capsule    Refill:  0    Supervising Provider:   LAMPTEY, PHILIP O [[id number]]   lidocaine  (XYLOCAINE ) 2 % solution    Sig: Use as directed 5-10 mLs in the mouth or throat every 6 (six) hours as needed.    Dispense:  100 mL    Refill:  0    Supervising Provider:   BLAISE ALEENE KIDD [[id number]]   promethazine-dextromethorphan (PROMETHAZINE-DM) 6.25-15 MG/5ML syrup    Sig: Take 5 mLs by mouth 4 (four) times daily as needed.    Dispense:  118 mL    Refill:  0    Supervising Provider:   BLAISE ALEENE KIDD [[id number]]     *If you need refills on other medications prior to your next appointment, please contact your pharmacy*  Follow-Up: Call back or seek an in-person evaluation if the symptoms worsen or if the condition fails to improve as anticipated.  [facility] [phone]  Other Instructions Influenza, Adult Influenza is also called the flu. It's an infection that affects your respiratory tract. This includes your nose, throat, windpipe, and lungs. The flu is contagious. This means it spreads easily from person to person. It causes symptoms that are like a cold. It can also cause  a high fever and body aches. What are the causes? The flu is caused by the influenza virus. You can get it by: Breathing in droplets that are in the air after an infected person coughs or sneezes. Touching something that has the virus on it and then touching your mouth, nose, or eyes. What increases the risk? You may be more likely to get the flu if: You don't wash your hands often. You're near a lot of people during cold and flu season. You touch your mouth, eyes, or nose without washing your hands first. You don't get a flu shot each year. You may also be more at risk for the flu and serious problems, such as a lung infection called pneumonia, if: You're  older than 65. You're pregnant. Your immune system is weak. Your immune system is your body's defense system. You have a long-term, or chronic, condition, such as: Heart, kidney, or lung disease. Diabetes. A liver disorder. Asthma. You're very overweight. You have anemia. This is when you don't have enough red blood cells in your body. What are the signs or symptoms? Flu symptoms often start all of a sudden. They may last 4-14 days and include: Fever and chills. Headaches, body aches, or muscle aches. Sore throat. Cough. Runny or stuffy nose. Discomfort in your chest. Not wanting to eat as much as normal. Feeling weak or tired. Feeling dizzy. Nausea or vomiting. How is this diagnosed? The flu may be diagnosed based on your symptoms and medical history. You may also have a physical exam. A swab may be taken from your nose or throat and tested for the virus. How is this treated? If the flu is found early, you can be treated with antiviral medicine. This may be given to you by mouth or through an IV. It can help you feel less sick and get better faster. Taking care of yourself at home can also help your symptoms get better. Your health care provider may tell you to: Take over-the-counter medicines. Drink lots of fluids. The flu often goes away on its own. If you have very bad symptoms or problems caused by the flu, you may need to be treated in a hospital. Follow these instructions at home: Activity Rest as needed. Get lots of sleep. Stay home from work or school as told by your provider. Leave home only to go see your provider. Do not leave home for other reasons until you don't have a fever for 24 hours without taking medicine. Eating and drinking Take an oral rehydration solution (ORS). This is a drink that is sold at pharmacies and stores. Drink enough fluid to keep your pee pale yellow. Try to drink small amounts of clear fluids. These include water, ice chips, fruit juice  mixed with water, and low-calorie sports drinks. Try to eat bland foods that are easy to digest. These include bananas, applesauce, rice, lean meats, toast, and crackers. Avoid drinks that have a lot of sugar or caffeine in them. These include energy drinks, regular sports drinks, and soda. Do not drink alcohol. Do not eat spicy or fatty foods. General instructions     Take your medicines only as told by your provider. Use a cool mist humidifier to add moisture to the air in your home. This can make it easier for you to breathe. You should also clean the humidifier every day. To do so: Empty the water. Pour clean water in. Cover your mouth and nose when you cough or sneeze. Wash your  hands with soap and water often and for at least 20 seconds. It's extra important to do so after you cough or sneeze. If you can't use soap and water, use hand sanitizer. How is this prevented?  Get a flu shot every year. Ask your provider when you should get your flu shot. Stay away from people who are sick during fall and winter. Fall and winter are cold and flu season. Contact a health care provider if: You get new symptoms. You have chest pain. You have watery poop, also called diarrhea. You have a fever. Your cough gets worse. You start to have more mucus. You feel like you may vomit, or you vomit. Get help right away if: You become short of breath or have trouble breathing. Your skin or nails turn blue. You have very bad pain or stiffness in your neck. You get a sudden headache or pain in your face or ear. You vomit each time you eat or drink. These symptoms may be an emergency. Call 911 right away. Do not wait to see if the symptoms will go away. Do not drive yourself to the hospital. This information is not intended to replace advice given to you by your health care provider. Make sure you discuss any questions you have with your health care provider. Document Revised: 09/11/2023 Document  Reviewed: 01/16/2023 Elsevier Patient Education  2024 [employer]   If you have been instructed to have an in-person evaluation today at a local Urgent Care facility, please use the link below. It will take you to a list of all of our available [facility], including address, phone number and hours of operation. Please do not delay care.  [facility]  If you or a family member do not have a primary care provider, use the link below to schedule a visit and establish care. When you choose a [facility] primary care physician or advanced practice provider, you gain a long-term partner in health. Find a Primary Care Provider  Learn more about [facility] in-office and virtual care options: [facility] Get Care Now "

## 2024-12-24 NOTE — Progress Notes (Signed)
 " Virtual Visit Consent   Tanner Scott, you are scheduled for a virtual visit with a [facility] provider today. Just as with appointments in the office, your consent must be obtained to participate. Your consent will be active for this visit and any virtual visit you may have with one of our providers in the next 365 days. If you have a MyChart account, a copy of this consent can be sent to you electronically.  As this is a virtual visit, video technology does not allow for your provider to perform a traditional examination. This may limit your provider's ability to fully assess your condition. If your provider identifies any concerns that need to be evaluated in person or the need to arrange testing (such as labs, EKG, etc.), we will make arrangements to do so. Although advances in technology are sophisticated, we cannot ensure that it will always work on either your end or our end. If the connection with a video visit is poor, the visit may have to be switched to a telephone visit. With either a video or telephone visit, we are not always able to ensure that we have a secure connection.  By engaging in this virtual visit, you consent to the provision of healthcare and authorize for your insurance to be billed (if applicable) for the services provided during this visit. Depending on your insurance coverage, you may receive a charge related to this service.  I need to obtain your verbal consent now. Are you willing to proceed with your visit today? Tanner Scott has provided verbal consent on 12/24/2024 for a virtual visit (video or telephone). [name], PA-C  Date: 12/24/2024 2:24 PM   Virtual Visit via Video Note   I, [name], connected with  Tanner Scott  ([id number], 53-Nov-1972) on 12/24/2024 at  2:15 PM EST by a video-enabled telemedicine application and verified that I am speaking with the correct person using two identifiers.  Location: Patient: Virtual Visit  Location Patient: Home Provider: Virtual Visit Location Provider: Home Office   I discussed the limitations of evaluation and management by telemedicine and the availability of in person appointments. The patient expressed understanding and agreed to proceed.    History of Present Illness: Tanner Scott is a 54 y.o. who identifies as a male who was assigned male at birth, and is being seen today for flu-like symptoms.  HPI: URI  This is a new problem. The current episode started yesterday (exposed to Flu B, now with symptoms that started yesterday morning). The maximum temperature recorded prior to his arrival was 101 - 101.9 F. The fever has been present for Less than 1 day. Associated symptoms include chest pain (tightness), congestion, coughing, headaches, a plugged ear sensation (left), rhinorrhea, a sore throat and swollen glands. Pertinent negatives include no diarrhea, ear pain, nausea, sinus pain, vomiting or wheezing. Associated symptoms comments: Fatigue, chills, body aches. Treatments tried: Theraflu, sudafed. The treatment provided no relief.     Problems:  Patient Active Problem List   Diagnosis Date Noted   Allergic rhinitis 11/02/2024   GAD (generalized anxiety disorder) 11/02/2024   Mixed hyperlipidemia 11/02/2024    Allergies: Allergies[1] Medications: Current Medications[2]  Observations/Objective: Patient is well-developed, well-nourished in no acute distress.  Resting comfortably at home.  Head is normocephalic, atraumatic.  No labored breathing.  Speech is clear and coherent with logical content.  Patient is alert and oriented at baseline.    Assessment and Plan: 1. Influenza B (  Primary) - oseltamivir (TAMIFLU) 75 MG capsule; Take 1 capsule (75 mg total) by mouth 2 (two) times daily.  Dispense: 10 capsule; Refill: 0 - lidocaine  (XYLOCAINE ) 2 % solution; Use as directed 5-10 mLs in the mouth or throat every 6 (six) hours as needed.  Dispense: 100 mL; Refill:  0 - promethazine-dextromethorphan (PROMETHAZINE-DM) 6.25-15 MG/5ML syrup; Take 5 mLs by mouth 4 (four) times daily as needed.  Dispense: 118 mL; Refill: 0  - Suspect influenza due to symptoms and positive exposure to Influenza B - Tamiflu prescribed - Promethazine DM  for cough - Viscous Lidocaine  for sore throat - Continue OTC medication of choice for symptomatic management - Push fluids - Rest - Seek in person evaluation if symptoms worsen or fail to improve    Follow Up Instructions: I discussed the assessment and treatment plan with the patient. The patient was provided an opportunity to ask questions and all were answered. The patient agreed with the plan and demonstrated an understanding of the instructions.  A copy of instructions were sent to the patient via MyChart unless otherwise noted below.     The patient was advised to call back or seek an in-person evaluation if the symptoms worsen or if the condition fails to improve as anticipated.    Jacquese Cassarino M Leza Apsey, PA-C     [1]  Allergies Allergen Reactions   Penicillins Rash  [2]  Current Outpatient Medications:    lidocaine  (XYLOCAINE ) 2 % solution, Use as directed 5-10 mLs in the mouth or throat every 6 (six) hours as needed., Disp: 100 mL, Rfl: 0   oseltamivir (TAMIFLU) 75 MG capsule, Take 1 capsule (75 mg total) by mouth 2 (two) times daily., Disp: 10 capsule, Rfl: 0   promethazine-dextromethorphan (PROMETHAZINE-DM) 6.25-15 MG/5ML syrup, Take 5 mLs by mouth 4 (four) times daily as needed., Disp: 118 mL, Rfl: 0   atorvastatin  (LIPITOR) 10 MG tablet, Take 1 tablet (10 mg total) by mouth daily., Disp: 90 tablet, Rfl: 1   Cholecalciferol (VITAMIN D-3 PO), Take 1,000 Units by mouth daily., Disp: , Rfl:    Coenzyme Q10 (CO Q-10) 100 MG CAPS, Take by mouth., Disp: , Rfl:    fexofenadine (ALLEGRA) 180 MG tablet, Take 180 mg by mouth daily., Disp: , Rfl:    gabapentin (NEURONTIN) 100 MG capsule, Take 100 mg by mouth., Disp:  , Rfl:    Multiple Vitamin (MULTIVITAMIN) capsule, Take 1 capsule by mouth daily., Disp: , Rfl:    Probiotic Product (PROBIOTIC ADVANCED PO), Take by mouth., Disp: , Rfl:    saw palmetto 500 MG capsule, Take 540 mg by mouth daily., Disp: , Rfl:    sertraline (ZOLOFT) 50 MG tablet, Take 75 mg by mouth daily., Disp: , Rfl:   "

## 2025-01-28 ENCOUNTER — Ambulatory Visit: Admitting: Family Medicine

## 2025-06-06 ENCOUNTER — Encounter
